# Patient Record
Sex: Male | Born: 1990 | Race: White | Hispanic: No | Smoking: Never smoker
Health system: Southern US, Community
[De-identification: ages and names within clinical notes are randomized; demographics above are authoritative.]

## PROBLEM LIST (undated history)

## (undated) DIAGNOSIS — J45990 Exercise induced bronchospasm: Secondary | ICD-10-CM

## (undated) DIAGNOSIS — L0501 Pilonidal cyst with abscess: Secondary | ICD-10-CM

## (undated) DIAGNOSIS — T7840XA Allergy, unspecified, initial encounter: Secondary | ICD-10-CM

## (undated) DIAGNOSIS — E785 Hyperlipidemia, unspecified: Secondary | ICD-10-CM

## (undated) DIAGNOSIS — E7800 Pure hypercholesterolemia, unspecified: Secondary | ICD-10-CM

## (undated) HISTORY — DX: Pure hypercholesterolemia, unspecified: E78.00

## (undated) HISTORY — DX: Pilonidal cyst with abscess: L05.01

## (undated) HISTORY — DX: Hyperlipidemia, unspecified: E78.5

## (undated) HISTORY — DX: Allergy, unspecified, initial encounter: T78.40XA

## (undated) HISTORY — PX: WISDOM TOOTH EXTRACTION: SHX21

## (undated) HISTORY — DX: Exercise induced bronchospasm: J45.990

---

## 2015-11-24 ENCOUNTER — Encounter: Payer: Self-pay | Admitting: Physician Assistant

## 2015-11-24 ENCOUNTER — Ambulatory Visit (INDEPENDENT_AMBULATORY_CARE_PROVIDER_SITE_OTHER): Payer: BLUE CROSS/BLUE SHIELD | Admitting: Physician Assistant

## 2015-11-24 VITALS — BP 118/77 | HR 63 | Ht 70.0 in | Wt 181.0 lb

## 2015-11-24 DIAGNOSIS — Z0000 Encounter for general adult medical examination without abnormal findings: Secondary | ICD-10-CM

## 2015-11-24 DIAGNOSIS — J45990 Exercise induced bronchospasm: Secondary | ICD-10-CM

## 2015-11-24 DIAGNOSIS — Z13220 Encounter for screening for lipoid disorders: Secondary | ICD-10-CM

## 2015-11-24 DIAGNOSIS — Z131 Encounter for screening for diabetes mellitus: Secondary | ICD-10-CM | POA: Diagnosis not present

## 2015-11-24 HISTORY — DX: Exercise induced bronchospasm: J45.990

## 2015-11-24 NOTE — Progress Notes (Signed)
   Subjective:    Patient ID: Troy Estrada, male    DOB: 11/06/1990, 25 y.o.   MRN: [id number]  HPI Pt is a 25 yo male who presents to the clinic to establish care.   He has exercise induced asthma. Exercises daily and does not use inhaler.  He is on no medications.   .. Family History  Problem Relation Age of Onset  . Depression Mother   . Hyperlipidemia Mother   . Hypertension Mother   . Depression Father   . Diabetes Father   . Hyperlipidemia Father   . Depression Sister   . Depression Brother   . Diabetes Maternal Uncle   . Cancer Maternal Grandmother     cervical  . Diabetes Maternal Grandmother   . Hypertension Maternal Grandmother   . Alzheimer's disease Maternal Grandmother   . Cancer Maternal Grandfather     prostate  . Hypertension Maternal Grandfather   . Dementia Paternal Grandfather   . Depression Sister   . Bipolar disorder Sister    .[name]. Social History   Social History  . Marital Status: Unknown    Spouse Name: N/A  . Number of Children: N/A  . Years of Education: N/A   Occupational History  . Not on file.   Social History Main Topics  . Smoking status: Never Smoker   . Smokeless tobacco: Not on file  . Alcohol Use: 0.0 oz/week    0 Standard drinks or equivalent per week  . Drug Use: No  . Sexual Activity: Not Currently   Other Topics Concern  . Not on file   Social History Narrative  . No narrative on file   He has not seen health care provider in a while and wanted a check up.    Review of Systems  All other systems reviewed and are negative.      Objective:   Physical Exam  Constitutional: He is oriented to person, place, and time. He appears well-developed and well-nourished.  HENT:  Head: Normocephalic and atraumatic.  Right Ear: External ear normal.  Left Ear: External ear normal.  Nose: Nose normal.  Mouth/Throat: Oropharynx is clear and moist. No oropharyngeal exudate.  Eyes: Conjunctivae are normal. Right eye exhibits no  discharge. Left eye exhibits no discharge.  Neck: Normal range of motion. Neck supple. No thyromegaly present.  Cardiovascular: Normal rate, regular rhythm and normal heart sounds.   Pulmonary/Chest: Effort normal and breath sounds normal.  Abdominal: Soft. Bowel sounds are normal. There is no tenderness.  Lymphadenopathy:    He has no cervical adenopathy.  Neurological: He is alert and oriented to person, place, and time.  Skin: Skin is dry.  Psychiatric: He has a normal mood and affect. His behavior is normal.          Assessment & Plan:  Screening labs ordered lipid, cmp, cbc.   Elevated BP- rechecked and went down. Reassured patient.

## 2015-11-24 NOTE — Patient Instructions (Signed)

## 2015-12-03 DIAGNOSIS — Z13220 Encounter for screening for lipoid disorders: Secondary | ICD-10-CM | POA: Diagnosis not present

## 2015-12-03 DIAGNOSIS — Z0000 Encounter for general adult medical examination without abnormal findings: Secondary | ICD-10-CM | POA: Diagnosis not present

## 2015-12-03 DIAGNOSIS — Z131 Encounter for screening for diabetes mellitus: Secondary | ICD-10-CM | POA: Diagnosis not present

## 2015-12-03 LAB — CBC WITH DIFFERENTIAL/PLATELET
BASOS PCT: 1 %
Basophils Absolute: 46 cells/uL (ref 0–200)
EOS PCT: 3 %
Eosinophils Absolute: 138 cells/uL (ref 15–500)
HEMATOCRIT: 40.9 % (ref 38.5–50.0)
HEMOGLOBIN: 13.7 g/dL (ref 13.2–17.1)
LYMPHS ABS: 1978 {cells}/uL (ref 850–3900)
Lymphocytes Relative: 43 %
MCH: 29.1 pg (ref 27.0–33.0)
MCHC: 33.5 g/dL (ref 32.0–36.0)
MCV: 86.8 fL (ref 80.0–100.0)
MONO ABS: 322 {cells}/uL (ref 200–950)
MPV: 10.3 fL (ref 7.5–12.5)
Monocytes Relative: 7 %
NEUTROS ABS: 2116 {cells}/uL (ref 1500–7800)
Neutrophils Relative %: 46 %
Platelets: 275 10*3/uL (ref 140–400)
RBC: 4.71 MIL/uL (ref 4.20–5.80)
RDW: 13 % (ref 11.0–15.0)
WBC: 4.6 10*3/uL (ref 3.8–10.8)

## 2015-12-04 LAB — COMPLETE METABOLIC PANEL WITH GFR
ALBUMIN: 4.3 g/dL (ref 3.6–5.1)
ALK PHOS: 80 U/L (ref 40–115)
ALT: 13 U/L (ref 9–46)
AST: 14 U/L (ref 10–40)
BUN: 17 mg/dL (ref 7–25)
CALCIUM: 9.5 mg/dL (ref 8.6–10.3)
CO2: 27 mmol/L (ref 20–31)
Chloride: 103 mmol/L (ref 98–110)
Creat: 0.94 mg/dL (ref 0.60–1.35)
GFR, Est African American: >89 mL/min (ref >=60)
GFR, Est Non African American: >89 mL/min (ref >=60)
GLUCOSE: 94 mg/dL (ref 65–99)
Potassium: 4.6 mmol/L (ref 3.5–5.3)
Sodium: 140 mmol/L (ref 135–146)
TOTAL PROTEIN: 6.9 g/dL (ref 6.1–8.1)
Total Bilirubin: 0.3 mg/dL (ref 0.2–1.2)

## 2015-12-04 LAB — LIPID PANEL
CHOLESTEROL: 209 mg/dL — AB (ref 125–200)
HDL: 52 mg/dL (ref >=40)
LDL Cholesterol: 144 mg/dL — ABNORMAL HIGH (ref <130)
Total CHOL/HDL Ratio: 4 Ratio (ref <=5.0)
Triglycerides: 64 mg/dL (ref <150)
VLDL: 13 mg/dL (ref <30)

## 2015-12-05 ENCOUNTER — Encounter: Payer: Self-pay | Admitting: Physician Assistant

## 2015-12-05 DIAGNOSIS — E7800 Pure hypercholesterolemia, unspecified: Secondary | ICD-10-CM

## 2015-12-05 HISTORY — DX: Pure hypercholesterolemia, unspecified: E78.00

## 2016-02-08 DIAGNOSIS — Z23 Encounter for immunization: Secondary | ICD-10-CM | POA: Diagnosis not present

## 2016-05-19 DIAGNOSIS — D2239 Melanocytic nevi of other parts of face: Secondary | ICD-10-CM | POA: Diagnosis not present

## 2016-05-19 DIAGNOSIS — D2262 Melanocytic nevi of left upper limb, including shoulder: Secondary | ICD-10-CM | POA: Diagnosis not present

## 2016-05-19 DIAGNOSIS — D2261 Melanocytic nevi of right upper limb, including shoulder: Secondary | ICD-10-CM | POA: Diagnosis not present

## 2016-05-19 DIAGNOSIS — D224 Melanocytic nevi of scalp and neck: Secondary | ICD-10-CM | POA: Diagnosis not present

## 2017-01-23 DIAGNOSIS — Z23 Encounter for immunization: Secondary | ICD-10-CM | POA: Diagnosis not present

## 2017-05-06 DIAGNOSIS — J069 Acute upper respiratory infection, unspecified: Secondary | ICD-10-CM | POA: Diagnosis not present

## 2017-05-06 DIAGNOSIS — R05 Cough: Secondary | ICD-10-CM | POA: Diagnosis not present

## 2017-05-25 ENCOUNTER — Telehealth: Payer: Self-pay

## 2017-05-25 NOTE — Telephone Encounter (Signed)
Patient called in stating he will be traveling to [place] and wanted to know what vaccinations he should obtain as well as anything he should be aware of traveling out of the country.  Patient also stated he did not know how to go about getting vaccinations...whether our office can do the vaccinations or should he contact the health department. Patient stated he did not know if an office visit is needed to discuss this and his vaccinations.

## 2017-05-28 NOTE — Telephone Encounter (Signed)
I would make sure you had flu shot, tetanus shot, hep A, typhoid, malaria. If going to higher elevations then need yellow fever. I would suggest going to health department as there are vaccines that we don't have. You can also go to CDC travel for [place] and print off suggested vaccines to get.

## 2017-05-30 NOTE — Telephone Encounter (Signed)
Patient scheduled for travel medication appointment. To get started on the vaccines we provide in the office.

## 2017-06-05 ENCOUNTER — Encounter: Payer: Self-pay | Admitting: Physician Assistant

## 2017-06-05 ENCOUNTER — Ambulatory Visit (INDEPENDENT_AMBULATORY_CARE_PROVIDER_SITE_OTHER): Payer: BLUE CROSS/BLUE SHIELD | Admitting: Physician Assistant

## 2017-06-05 VITALS — BP 134/77 | HR 67 | Ht 70.0 in | Wt 190.0 lb

## 2017-06-05 DIAGNOSIS — Z7189 Other specified counseling: Secondary | ICD-10-CM | POA: Diagnosis not present

## 2017-06-05 DIAGNOSIS — Z0184 Encounter for antibody response examination: Secondary | ICD-10-CM | POA: Diagnosis not present

## 2017-06-05 DIAGNOSIS — Z7184 Encounter for health counseling related to travel: Secondary | ICD-10-CM

## 2017-06-05 DIAGNOSIS — Z23 Encounter for immunization: Secondary | ICD-10-CM | POA: Diagnosis not present

## 2017-06-05 MED ORDER — TYPHOID VACCINE PO CPDR: 1.0000 | DELAYED_RELEASE_CAPSULE | ORAL | 0 refills | Status: DC

## 2017-06-05 MED ORDER — PRIMAQUINE PHOSPHATE 26.3 MG PO TABS: 30.0000 mg | ORAL_TABLET | Freq: Every day | ORAL | 0 refills | Status: DC

## 2017-06-06 ENCOUNTER — Encounter: Payer: Self-pay | Admitting: Physician Assistant

## 2017-06-06 LAB — HEPATITIS B SURFACE ANTIBODY,QUALITATIVE: Hep B S Ab: NONREACTIVE

## 2017-06-06 NOTE — Progress Notes (Signed)
   Subjective:    Patient ID: Troy Estrada, male    DOB: 08/31/1990, 27 y.o.   MRN: [id number]  HPI Pt is a 27 yo male who presents to the clinic to discuss vaccines needed for travel to [place] in June 2019. He will be traveling throughout the country and doing a lot of hiking.    Review of Systems  All other systems reviewed and are negative.      Objective:   Physical Exam  Constitutional: He is oriented to person, place, and time. He appears well-developed and well-nourished.  Cardiovascular: Normal rate, regular rhythm and normal heart sounds.  Neurological: He is alert and oriented to person, place, and time.  Psychiatric: He has a normal mood and affect. His behavior is normal.          Assessment & Plan:  [name].[name].Diagnoses and all orders for this visit:  Counseling about travel -     typhoid (VIVOTIF) DR capsule; Take 1 capsule by mouth every other day. For one week to end one week before travel. -     Hepatitis B Surface AntiBODY -     primaquine 26.3 MG tablet; Take 2 tablets (30 mg total) by mouth daily. Start 1-2 days before travel and then 7 days after arrive back home. -     Hepatitis A vaccine adult IM -     Tdap vaccine greater than or equal to 7yo IM  Immunity status testing -     Hepatitis B Surface AntiBODY  Other orders -     Cancel: Tdap vaccine greater than or equal to 7yo IM -     Cancel: Flu Vaccine QUAD 36+ mos IM -     Cancel: Hepatitis A hepatitis B combined vaccine IM   Discussed with patient recommended vaccines for travel to [place] via CDC.  Pt states he has all of his childhood vaccines except he is unsure about hep B.  Ordered titer to confirm.  Given hep A first dose to return in 6 months for the next dose.  Tdap given.  Flu shot up to date.  Oral vaccine for typhoid given.  Malaria preventative given.  We do not have rabies or yellow fever vaccines and must go to the health department.    When closer to time he leaves agreed to give zofran  for any unexpected nausea and cipro course for any travelers diarrhea.   [name]..Spent 30 minutes with patient and greater than 50 percent of visit spent counseling patient regarding treatment plan.

## 2017-06-08 NOTE — Progress Notes (Signed)
He will have to call health department for injection prices for typhoid?  Does he mean typhoid? Malaria is oral only. Confirm and we could call pharmacy to see what is the cheapest there are multiple ones.

## 2017-06-15 ENCOUNTER — Ambulatory Visit (INDEPENDENT_AMBULATORY_CARE_PROVIDER_SITE_OTHER): Payer: BLUE CROSS/BLUE SHIELD | Admitting: Physician Assistant

## 2017-06-15 VITALS — BP 136/78 | HR 73 | Temp 98.6°F

## 2017-06-15 DIAGNOSIS — Z23 Encounter for immunization: Secondary | ICD-10-CM

## 2017-06-15 NOTE — Progress Notes (Signed)
Pt came into clinic to start his Hep B vaccine series. Based on lab work, he was not immune. Pt is going out of country and these vaccines were recommended. Pt tolerated immunization administration in right deltoid well, no immediate complications. Pt reports he does not need a printed copy at this time. Advised to follow up for second immunization in 1 month. Verbalized understanding.

## 2017-07-09 ENCOUNTER — Ambulatory Visit: Payer: BLUE CROSS/BLUE SHIELD

## 2017-07-24 ENCOUNTER — Ambulatory Visit (INDEPENDENT_AMBULATORY_CARE_PROVIDER_SITE_OTHER): Payer: BLUE CROSS/BLUE SHIELD | Admitting: Physician Assistant

## 2017-07-24 VITALS — BP 110/75 | HR 60 | Temp 98.2°F | Resp 16 | Wt 190.6 lb

## 2017-07-24 DIAGNOSIS — Z23 Encounter for immunization: Secondary | ICD-10-CM | POA: Diagnosis not present

## 2017-07-24 MED ORDER — HEPATITIS B VAC RECOMBINANT 10 MCG/ML IJ SUSP: 1.0000 mL | Freq: Once | INTRAMUSCULAR | Status: DC

## 2017-07-24 NOTE — Progress Notes (Signed)
HPI: Patient is here for his second Hepatitis B vaccination. Patient states he did fine with his first vaccination. Patient denies fatigue, rashes, shortness of breath, chest pains.  Assessment and Plan: Patient tolerated injection - Left Deltoid - well without complications. Patient advised to schedule third vaccination six months from first vaccine - 06/15/17 - which would be in August.    Agree with above plan. Tandy GawJade Breeback PA-C

## 2017-07-31 ENCOUNTER — Ambulatory Visit (INDEPENDENT_AMBULATORY_CARE_PROVIDER_SITE_OTHER): Payer: BLUE CROSS/BLUE SHIELD | Admitting: Physician Assistant

## 2017-07-31 ENCOUNTER — Encounter: Payer: Self-pay | Admitting: Physician Assistant

## 2017-07-31 VITALS — BP 132/79 | HR 58 | Ht 70.0 in | Wt 187.0 lb

## 2017-07-31 DIAGNOSIS — Z7189 Other specified counseling: Secondary | ICD-10-CM

## 2017-07-31 DIAGNOSIS — Z7184 Encounter for health counseling related to travel: Secondary | ICD-10-CM

## 2017-07-31 DIAGNOSIS — J45990 Exercise induced bronchospasm: Secondary | ICD-10-CM | POA: Diagnosis not present

## 2017-07-31 MED ORDER — ONDANSETRON HCL 4 MG PO TABS: 4.0000 mg | ORAL_TABLET | Freq: Three times a day (TID) | ORAL | 0 refills | Status: DC | PRN

## 2017-07-31 MED ORDER — ALBUTEROL SULFATE HFA 108 (90 BASE) MCG/ACT IN AERS
2.0000 | INHALATION_SPRAY | Freq: Four times a day (QID) | RESPIRATORY_TRACT | 1 refills | Status: DC | PRN
Start: 2017-07-31 — End: 2018-09-23

## 2017-07-31 NOTE — Progress Notes (Signed)
   Subjective:    Patient ID: Troy Estrada, male    DOB: Jul 24, 1990, 27 y.o.   MRN: [id number]  HPI Pt is a 27 yo male who presents to the clinic to discuss getting some prescriptions for his travel in June to [place].   He is in process of getting hep A and B vaccines.   He has a hx of exercise induced asthma and would like an inhaler due to some hiking he will be doing at high elevations.   .. Active Ambulatory Problems    Diagnosis Date Noted  . Exercise-induced asthma 11/24/2015  . Elevated LDL cholesterol level 12/05/2015   Resolved Ambulatory Problems    Diagnosis Date Noted  . No Resolved Ambulatory Problems   No Additional Past Medical History      Review of Systems  All other systems reviewed and are negative.      Objective:   Physical Exam  Constitutional: He is oriented to person, place, and time. He appears well-developed and well-nourished.  HENT:  Head: Normocephalic and atraumatic.  Neurological: He is alert and oriented to person, place, and time.  Psychiatric: He has a normal mood and affect. His behavior is normal.          Assessment & Plan:  Marland KitchenMarland KitchenDiagnoses and all orders for this visit:  Exercise-induced asthma -     albuterol (PROVENTIL HFA;VENTOLIN HFA) 108 (90 Base) MCG/ACT inhaler; Inhale 2 puffs into the lungs every 6 (six) hours as needed for wheezing or shortness of breath.  Counseling for travel -     ondansetron (ZOFRAN) 4 MG tablet; Take 1 tablet (4 mg total) by mouth every 8 (eight) hours as needed for nausea or vomiting. -     albuterol (PROVENTIL HFA;VENTOLIN HFA) 108 (90 Base) MCG/ACT inhaler; Inhale 2 puffs into the lungs every 6 (six) hours as needed for wheezing or shortness of breath.   Made appt for follow up hep A and hep B.  Albuterol rx given for as needed.  zofran for travel bag.

## 2017-09-20 ENCOUNTER — Encounter: Payer: Self-pay | Admitting: Family Medicine

## 2017-09-20 ENCOUNTER — Ambulatory Visit: Payer: BLUE CROSS/BLUE SHIELD | Admitting: Family Medicine

## 2017-09-20 VITALS — BP 135/72 | HR 97 | Ht 70.0 in | Wt 189.0 lb

## 2017-09-20 DIAGNOSIS — T148XXA Other injury of unspecified body region, initial encounter: Secondary | ICD-10-CM | POA: Diagnosis not present

## 2017-09-20 DIAGNOSIS — S50861A Insect bite (nonvenomous) of right forearm, initial encounter: Secondary | ICD-10-CM

## 2017-09-20 DIAGNOSIS — W57XXXA Bitten or stung by nonvenomous insect and other nonvenomous arthropods, initial encounter: Secondary | ICD-10-CM

## 2017-09-20 DIAGNOSIS — R21 Rash and other nonspecific skin eruption: Secondary | ICD-10-CM | POA: Diagnosis not present

## 2017-09-20 MED ORDER — TRIAMCINOLONE ACETONIDE 0.5 % EX OINT: 1.0000 "application " | TOPICAL_OINTMENT | Freq: Two times a day (BID) | CUTANEOUS | 1 refills | Status: DC

## 2017-09-20 NOTE — Patient Instructions (Signed)
Thank you for coming in today. Complete typhoid vaccine series.  Apply the ointment to the itch 2-3 x daily.  Stop when the itchy bump goes away.  Let me know if you feel sick or have a fever or body aches or headache.   Recheck as needed.

## 2017-09-21 NOTE — Progress Notes (Signed)
Troy Estrada is a 27 y.o. male who presents to [facility] today for rash.  Injury notes a small erythematous itchy papules on his right forearm.  He cannot think of any exposures.  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 He has been outside landing grass as part of his job.  He denies any tick bites.  He was seen in urgent care he had a blood test that he thinks is from Lyme and was prescribed triamcinolone lotion.  He has not filled the prescription yet as he wants a second opinion.  He feels well with no fevers or chills nausea vomiting or other rash.   ROS as above:  Exam:  BP 135/72   Pulse 97   Ht  (1.778 m)   Wt 189 lb (85.7 kg)   BMI 27.12 kg/m  Gen: Well NAD HEENT: EOMI,  MMM Lungs: Normal work of breathing. CTABL Heart: RRR no MRG Abd: NABS, Soft. Nondistended, Nontender Exts: Brisk capillary refill, warm and well perfused.  Skin: Small 2 mm erythematous papule right forearm.     Assessment and Plan: 27 y.o. male with small erythematous papule likely local reaction to arthropod bite.  Very doubtful for tick bite or tick borne illness.  Treatment with triamcinolone ointment.  If patient became symptomatic systemically prescribed doxycycline and have patient return to clinic for serology testing.   No orders of the defined types were placed in this encounter.  Meds ordered this encounter  Medications  . triamcinolone ointment (KENALOG) 0.5 %    Sig: Apply 1 application topically 2 (two) times daily. To affected area, avoid eyes and face    Dispense:  30 g    Refill:  1     Historical information moved to improve visibility of documentation.  No past medical history on file. No past surgical history on file. Social History   Tobacco Use  . Smoking status: Never Smoker  . Smokeless tobacco: Never Used  Substance Use Topics  . Alcohol  use: Yes    Alcohol/week: 0.0 oz   family history includes Alzheimer's disease in his maternal grandmother; Bipolar disorder in his sister; Cancer in his maternal grandfather and maternal grandmother; Dementia in his paternal grandfather; Depression in his brother, father, mother, sister, and sister; Diabetes in his father, maternal grandmother, and maternal uncle; Hyperlipidemia in his father and mother; Hypertension in his maternal grandfather, maternal grandmother, and mother.  Medications: Current Outpatient Medications  Medication Sig Dispense Refill  . albuterol (PROVENTIL HFA;VENTOLIN HFA) 108 (90 Base) MCG/ACT inhaler Inhale 2 puffs into the lungs every 6 (six) hours as needed for wheezing or shortness of breath. 1 Inhaler 1  . ondansetron (ZOFRAN) 4 MG tablet Take 1 tablet (4 mg total) by mouth every 8 (eight) hours as needed for nausea or vomiting. 20 tablet 0  . primaquine 26.3 MG tablet Take 2 tablets (30 mg total) by mouth daily. Start 1-2 days before travel and then 7 days after arrive back home. 40 tablet 0  . typhoid (VIVOTIF) DR capsule Take 1 capsule by mouth every other day. For one week to end one week before travel. 4 capsule 0  . triamcinolone ointment (KENALOG) 0.5 % Apply 1 application topically 2 (two) times daily. To affected area, avoid eyes and face 30 g 1   No current facility-administered medications for this visit.    No Known Allergies  Health Maintenance Health Maintenance  Topic Date Due  . HIV Screening  01/06/2006  . INFLUENZA VACCINE  12/06/2017  . TETANUS/TDAP  06/06/2027    Discussed warning signs or symptoms. Please see discharge instructions. Patient expresses understanding.

## 2017-12-12 ENCOUNTER — Ambulatory Visit (INDEPENDENT_AMBULATORY_CARE_PROVIDER_SITE_OTHER): Payer: BLUE CROSS/BLUE SHIELD | Admitting: Family Medicine

## 2017-12-12 VITALS — Temp 98.6°F

## 2017-12-12 DIAGNOSIS — Z23 Encounter for immunization: Secondary | ICD-10-CM

## 2017-12-12 MED ORDER — HEPATITIS A VACCINE 1440 EL U/ML IM SUSP: 1.0000 mL | Freq: Once | INTRAMUSCULAR | Status: DC

## 2017-12-12 MED ORDER — HEPATITIS B VAC RECOMBINANT 10 MCG/0.5ML IJ SUSP: 0.5000 mL | Freq: Once | INTRAMUSCULAR | Status: DC

## 2017-12-12 NOTE — Progress Notes (Signed)
Agree with documentation as above.   Sueellen Kayes, MD  

## 2017-12-12 NOTE — Progress Notes (Signed)
   Subjective:    Patient ID: Troy Estrada, male    DOB: 04/12/1991, 27 y.o.   MRN: [id number]  HPI  Greig Castillandrew is here for last Hep A and Hep B vaccine.   Review of Systems     Objective:   Physical Exam        Assessment & Plan:  Vaccines - Patient tolerated injection well without complications.

## 2018-01-30 DIAGNOSIS — Z23 Encounter for immunization: Secondary | ICD-10-CM | POA: Diagnosis not present

## 2018-09-04 ENCOUNTER — Encounter: Payer: Self-pay | Admitting: Physician Assistant

## 2018-09-05 ENCOUNTER — Encounter: Payer: Self-pay | Admitting: Family Medicine

## 2018-09-05 ENCOUNTER — Other Ambulatory Visit: Payer: Self-pay

## 2018-09-05 ENCOUNTER — Ambulatory Visit: Payer: BLUE CROSS/BLUE SHIELD | Admitting: Family Medicine

## 2018-09-05 VITALS — BP 137/86 | HR 112 | Temp 99.2°F | Wt 185.0 lb

## 2018-09-05 DIAGNOSIS — L0501 Pilonidal cyst with abscess: Secondary | ICD-10-CM

## 2018-09-05 HISTORY — PX: PILONIDAL CYST DRAINAGE: SHX743

## 2018-09-05 MED ORDER — DOXYCYCLINE HYCLATE 100 MG PO TABS: 100.0000 mg | ORAL_TABLET | Freq: Two times a day (BID) | ORAL | 0 refills | Status: DC

## 2018-09-05 NOTE — Progress Notes (Signed)
Troy Estrada is a 28 y.o. male who [facility] today for sacral pain.  Patient noted pain and swelling in his sacral region starting about 2 weeks ago.  He notes is become red painful and swollen.  He notes it started draining some foul-smelling purulent material about 2 days ago.  Symptoms have been worsening slowly.  He is tried Tylenol which helps.  He can recall back a few months ago he had a similar emesis episode which resolved spontaneously.  He has never had a formal diagnosis of pilonidal cyst or had incision and drainage of a cyst in this area.  He denies any fevers or chills nausea vomiting or diarrhea.  He rates his current symptoms is quite bothersome.  He denies fevers chills vomiting or diarrhea.  ROS as above:  Exam:  BP 137/86   Pulse (!) 112   Temp 99.2 F (37.3 C) (Oral)   Wt 185 lb (83.9 kg)   BMI 26.54 kg/m  Wt Readings from Last 5 Encounters:  09/05/18 185 lb (83.9 kg)  09/20/17 189 lb (85.7 kg)  07/31/17 187 lb (84.8 kg)  07/24/17 190 lb 9.6 oz (86.5 kg)  06/05/17 190 lb (86.2 kg)    Gen: Well NAD HEENT: EOMI,  MMM Lungs: Normal work of breathing. CTABL Heart: RRR no MRG Abd: NABS, Soft. Nondistended, Nontender Exts: Brisk capillary refill, warm and well perfused.  Skin: Erythematous swollen tender area at sacrum/coccyx area.  Small papule.  Thin foul-smelling purulent Cherrelle expressible.  Very tender to touch.   Abscess incision and drainage: Pilonidal cyst abscess . Consent obtained and timeout performed. Skin cleaned with alcohol, and cold spray applied. 3 mL of lidocaine  injected achieving good anesthesia. Skin was again cleaned with alcohol. A sharp incision was made to the area of fluctuance. The incision was widened and pus was expressed. Pus was cultured. Blunt dissection was used to break up loculations. Further  pus was expressed. Patient tolerated the procedure well. A dressing was applied   Lab and Radiology Results Results for orders placed or performed in visit on 09/05/18 (from the past 72 hour(s))  Wound culture     Status: None (Preliminary result)   Collection Time: 09/05/18  2:34 PM  Result Value Ref Range   MICRO NUMBER: [id number]    SPECIMEN QUALITY: Adequate    SOURCE: CYST    STATUS: PRELIMINARY    GRAM STAIN:      Few White blood cells seen Rare epithelial cells Rare Gram negative bacilli   No results found.    Assessment and Plan: 28 y.o. male with  Pilonidal cyst abscess.  Incised and drained today.  Culture obtained and patient was prescribed doxycycline.  Hold off on taking it unless he does not have rapid improvement.  He could potentially take doxycycline in the future if the symptoms start recurring.  Additionally discussed using chlorhexidine for good hygiene in this area once the wound is healed.  Discussed wound management strategies as well.  Recheck as needed in the near future.  PDMP not reviewed this encounter. Orders Placed This Encounter  Procedures  . Wound culture    Order Specific Question:   Source    Answer:   pilidinal cyst   Meds ordered this encounter  Medications  . doxycycline (VIBRA-TABS) 100 MG tablet    Sig: Take 1 tablet (100 mg total) by mouth 2 (two) times daily.  Dispense:  14 tablet    Refill:  0     Historical information moved to improve visibility of documentation.  Past Medical History:  Diagnosis Date  . Elevated LDL cholesterol level 12/05/2015  . Exercise-induced asthma 11/24/2015  . Pilonidal cyst with abscess 09/06/2018   Past Surgical History:  Procedure Laterality Date  . PILONIDAL CYST DRAINAGE N/A 09/05/2018   In office I&D   Social History   Tobacco Use  . Smoking status: Never Smoker  . Smokeless tobacco: Never Used  Substance Use Topics  . Alcohol use: Yes    Alcohol/week: 0.0 standard drinks   family  history includes Alzheimer's disease in his maternal grandmother; Bipolar disorder in his sister; Cancer in his maternal grandfather and maternal grandmother; Dementia in his paternal grandfather; Depression in his brother, father, mother, sister, and sister; Diabetes in his father, maternal grandmother, and maternal uncle; Hyperlipidemia in his father and mother; Hypertension in his maternal grandfather, maternal grandmother, and mother.  Medications: Current Outpatient Medications  Medication Sig Dispense Refill  . albuterol (PROVENTIL HFA;VENTOLIN HFA) 108 (90 Base) MCG/ACT inhaler Inhale 2 puffs into the lungs every 6 (six) hours as needed for wheezing or shortness of breath. 1 Inhaler 1  . doxycycline (VIBRA-TABS) 100 MG tablet Take 1 tablet (100 mg total) by mouth 2 (two) times daily. 14 tablet 0   No current facility-administered medications for this visit.    No Known Allergies   Discussed warning signs or symptoms. Please see discharge instructions. Patient expresses understanding.

## 2018-09-05 NOTE — Telephone Encounter (Signed)
Left a message for patient to schedule appointment.

## 2018-09-05 NOTE — Patient Instructions (Signed)
Thank you for coming in today. I will send you update when the culture is back.  I am sending in doxycycline antibiotic to take if it worsens or if it recurs.  Change dressing when it get dirty usually daily  Keep the skin clean once healed and consider chlorhexidine body wash in the future.    Incision and Drainage of a Pilonidal Cyst, Care After This sheet gives you information about how to care for yourself after your procedure. Your health care provider may also give you more specific instructions. If you have problems or questions, contact your health care provider. What can I expect after the procedure? After the procedure, it is common to have:  Pain that gets better when you take medicine.  Some fluid or blood coming from your wound. Follow these instructions at home: Medicines  Take over-the-counter and prescription medicines only as told by your health care provider.  If you were prescribed an antibiotic medicine, take it as told by your health care provider. Do not stop taking the antibiotic even if you start to feel better. Lifestyle  Do not do activities that irritate or put pressure on your buttocks for about 2 weeks, or as long as told by your health care provider. These activities include bike riding, running, and anything that involves a twisting motion.  Do not sit for long periods at a time without getting up to move around.  Sleep on your side instead of your back.  Avoid wearing tight underwear and tight pants. Bathing  Do not take baths or showers, swim, or use a hot tub until your health care provider approves. This depends on the type of wound you have from surgery.  While bathing, clean your buttocks area gently with soap and water.  After bathing: ? Pat the area dry with a soft, clean towel. ? Cover the area with a clean bandage (dressing), if told to by your health care provider. General instructions   If you are taking prescription pain medicine,  take actions to prevent or treat constipation. Your health care provider may recommend that you: ? Drink enough fluid to keep your urine pale yellow. ? Eat foods that are high in fiber, such as fresh fruits and vegetables, whole grains, and beans. ? Limit foods that are high in fat and processed sugars, such as fried or sweet foods. ? Take an over-the-counter or prescription medicine for constipation.  You will need to have a caregiver help you manage wound care and dressing changes. Your caregiver should: ? Wash his or her hands with soap and water before changing your dressing. If soap and water are not available, your caregiver should use hand sanitizer. ? Check your wound every day for signs of infection, such as:  Redness, swelling, or more pain.  More fluid or blood.  Warmth.  Pus or a bad smell. ? Follow any additional instructions from your health care provider on how to care for your wound, such as wound cleaning, wound flushing (irrigation), or packing your wound with a dressing.  Keep all follow-up visits as told by your health care provider. This is important. If you had incision and drainage with wound packing:  Return to your health care provider as instructed to have your packing material changed or removed.  Keep the area dry until your packing has been removed.  After the packing has been removed, you may start taking showers. If you had marsupialization:  You may start taking showers the day after  surgery, or when your health care provider approves.  Remove your dressing before you shower, but let the water from the shower moisten your dressing before you remove it. This will make it easier to remove.  Ask your health care provider when you can stop using a dressing. If you had incision and drainage without wound packing:  Change your dressing as directed.  Leave stitches (sutures), skin glue, or adhesive strips in place. These skin closures may need to stay in  place for 2 weeks or longer. If adhesive strip edges start to loosen and curl up, you may trim the loose edges. Do not remove adhesive strips completely unless your health care provider tells you to do that. Contact a health care provider if:  You have redness, swelling, or more pain around your wound.  You have more fluid or blood coming from your wound.  You have new bleeding from your wound.  Your wound feels warm to the touch.  There is pus or a bad smell coming from your wound.  You have pain that does not get better with medicine.  You have a fever or chills.  You have muscle aches.  You are dizzy.  You feel generally sick. Summary  After a procedure to drain a pilonidal cyst, it is common to have some fluid or blood coming from your wound.  If you were prescribed an antibiotic medicine, take it as told by your health care provider. Do not stop taking the antibiotic even if you start to feel better.  Return to your health care provider as instructed to have any packing material changed or removed. This information is not intended to replace advice given to you by your health care provider. Make sure you discuss any questions you have with your health care provider. Document Released: 05/25/2006 Document Revised: 04/16/2017 Document Reviewed: 04/16/2017 Elsevier Interactive Patient Education  2019 [employer]

## 2018-09-06 ENCOUNTER — Encounter: Payer: Self-pay | Admitting: Family Medicine

## 2018-09-06 DIAGNOSIS — L0501 Pilonidal cyst with abscess: Secondary | ICD-10-CM | POA: Insufficient documentation

## 2018-09-06 HISTORY — DX: Pilonidal cyst with abscess: L05.01

## 2018-09-11 LAB — WOUND CULTURE
MICRO NUMBER:: 436074
SPECIMEN QUALITY:: ADEQUATE

## 2018-09-12 ENCOUNTER — Encounter: Payer: Self-pay | Admitting: Family Medicine

## 2018-09-12 MED ORDER — CLINDAMYCIN HCL 300 MG PO CAPS: 300.0000 mg | ORAL_CAPSULE | Freq: Three times a day (TID) | ORAL | 0 refills | Status: DC

## 2018-09-23 ENCOUNTER — Encounter: Payer: Self-pay | Admitting: Family Medicine

## 2018-09-23 ENCOUNTER — Encounter: Payer: Self-pay | Admitting: Physician Assistant

## 2018-09-23 ENCOUNTER — Ambulatory Visit: Payer: BLUE CROSS/BLUE SHIELD | Admitting: Family Medicine

## 2018-09-23 VITALS — BP 114/69 | HR 67 | Temp 97.7°F | Wt 184.0 lb

## 2018-09-23 DIAGNOSIS — J45990 Exercise induced bronchospasm: Secondary | ICD-10-CM

## 2018-09-23 DIAGNOSIS — Z7184 Encounter for health counseling related to travel: Secondary | ICD-10-CM

## 2018-09-23 DIAGNOSIS — L0501 Pilonidal cyst with abscess: Secondary | ICD-10-CM | POA: Diagnosis not present

## 2018-09-23 MED ORDER — CLINDAMYCIN HCL 300 MG PO CAPS: 300.0000 mg | ORAL_CAPSULE | Freq: Three times a day (TID) | ORAL | 0 refills | Status: DC

## 2018-09-23 MED ORDER — ALBUTEROL SULFATE HFA 108 (90 BASE) MCG/ACT IN AERS: 2.0000 | INHALATION_SPRAY | Freq: Four times a day (QID) | RESPIRATORY_TRACT | 1 refills | Status: DC | PRN

## 2018-09-23 NOTE — Patient Instructions (Addendum)
Thank you for coming in today. Extend clindamycin antibiotic course for 9 more days.  If the drainage does not resolve and the pain returns let me know and we will likely drain it again.  I think it will go away soon with time and antibiotics.

## 2018-09-23 NOTE — Progress Notes (Signed)
Troy Estrada is a 28 y.o. male who presents to [facility]: Primary Care Sports Medicine today for follow-up pilonidal cyst abscess.  Patient was seen on April 30 for pilonidal cyst abscess.  He had incision and drainage at that time.  Culture was positive for Peptostreptococcus anaerobius.  He was treated empirically with doxycycline and then when symptoms returned on May 7 empirically with clindamycin.  In the interim he notes that he had a little bit of continued drainage.  He notes is not painful.  He feels pretty well with no fevers or chills.  He tolerates the clindamycin well.  He has about 2 more days left of the clindamycin.   ROS as above:  Exam:  BP 114/69    Pulse 67    Temp 97.7 F (36.5 C) (Oral)    Wt 184 lb (83.5 kg)    BMI 26.40 kg/m  Wt Readings from Last 5 Encounters:  09/23/18 184 lb (83.5 kg)  09/05/18 185 lb (83.9 kg)  09/20/17 189 lb (85.7 kg)  07/31/17 187 lb (84.8 kg)  07/24/17 190 lb 9.6 oz (86.5 kg)    Gen: Well NAD HEENT: EOMI,  MMM Lungs: Normal work of breathing. CTABL Heart: RRR no MRG Abd: NABS, Soft. Nondistended, Nontender Exts: Brisk capillary refill, warm and well perfused.  Skin: Small tiny nodule at gluteal cleft nontender no drainage.  No surrounding erythema or induration.  Lab and Radiology Results No results found for this or any previous visit (from the past 72 hour(s)). No results found.    Assessment and Plan: 28 y.o. male with resolving pilonidal cyst abscess.  Still draining a bit but no reaccumulation of abscess.  Patient has a tiny little nodule at the gluteal cleft.  Plan to extend clindamycin for 1 more week and watchful waiting.  Return for reexcision if abscess reaccumulate's and becomes painful.  I spent 15 minutes with this patient, greater than 50% was face-to-face time counseling regarding differential diagnosis treatment plan and  options.[name]  PDMP not reviewed this encounter. No orders of the defined types were placed in this encounter.  Meds ordered this encounter  Medications   clindamycin (CLEOCIN) 300 MG capsule    Sig: Take 1 capsule (300 mg total) by mouth 3 (three) times daily.    Dispense:  21 capsule    Refill:  0     Historical information moved to improve visibility of documentation.  Past Medical History:  Diagnosis Date   Elevated LDL cholesterol level 12/05/2015   Exercise-induced asthma 11/24/2015   Pilonidal cyst with abscess 09/06/2018   Past Surgical History:  Procedure Laterality Date   PILONIDAL CYST DRAINAGE N/A 09/05/2018   In office I&D   Social History   Tobacco Use   Smoking status: Never Smoker   Smokeless tobacco: Never Used  Substance Use Topics   Alcohol use: Yes    Alcohol/week: 0.0 standard drinks   family history includes Alzheimer's disease in his maternal grandmother; Bipolar disorder in his sister; Cancer in his maternal grandfather and maternal grandmother; Dementia in his paternal grandfather; Depression in his brother, father, mother, sister, and sister; Diabetes in his father, maternal grandmother, and maternal uncle; Hyperlipidemia in his father and mother; Hypertension in his maternal grandfather, maternal grandmother, and mother.  Medications: Current Outpatient Medications  Medication Sig Dispense Refill   albuterol (VENTOLIN HFA) 108 (90 Base) MCG/ACT inhaler Inhale 2 puffs into the lungs every 6 (six) hours as needed  for wheezing or shortness of breath. 1 Inhaler 1   clindamycin (CLEOCIN) 300 MG capsule Take 1 capsule (300 mg total) by mouth 3 (three) times daily. 21 capsule 0   No current facility-administered medications for this visit.    No Known Allergies   Discussed warning signs or symptoms. Please see discharge instructions. Patient expresses understanding.

## 2019-01-04 ENCOUNTER — Encounter: Payer: Self-pay | Admitting: Physician Assistant

## 2019-01-04 ENCOUNTER — Ambulatory Visit (INDEPENDENT_AMBULATORY_CARE_PROVIDER_SITE_OTHER)
Admission: RE | Admit: 2019-01-04 | Discharge: 2019-01-04 | Disposition: A | Discharge status: Home / Self Care | Payer: BC Managed Care – PPO | Source: Ambulatory Visit

## 2019-01-04 DIAGNOSIS — R05 Cough: Secondary | ICD-10-CM | POA: Diagnosis not present

## 2019-01-04 DIAGNOSIS — R6883 Chills (without fever): Secondary | ICD-10-CM

## 2019-01-04 DIAGNOSIS — Z20828 Contact with and (suspected) exposure to other viral communicable diseases: Secondary | ICD-10-CM | POA: Diagnosis not present

## 2019-01-04 NOTE — ED Provider Notes (Signed)
Virtual Visit via Video Note:  Troy Estrada  initiated request for Telemedicine visit with [facility] team. I connected with Troy Estrada  on 01/04/2019 at 9:25 AM  for a synchronized telemedicine visit using a video enabled HIPPA compliant telemedicine application. I verified that I am speaking with Troy Estrada  using two identifiers. Troy Hall-Potvin, PA-C  was physically located in a [facility] site and [name] was located at a different location.   The limitations of evaluation and management by telemedicine as well as the availability of in-person appointments were discussed. Patient was informed that he  may incur a bill ( including co-pay) for this virtual visit encounter. Troy Estrada  expressed understanding and gave verbal consent to proceed with virtual visit.     History of Present Illness:Troy Estrada  is a 28 y.o. male presents with concern regarding a shivering episode last night.  Patient states he went to bed at 11 last night, woke up around 1 AM.  States he had had a dream about shivering, woke up shivering.  [place] that he was shivering for a whole hour: Temperature at that time 99 Fahrenheit.  Patient states he also has been coughing a little bit more that evening, though has attributed this to increased dog care on his bed.  Patient felt briefly short of breath while coughing, though walking around improved.  Patient took his albuterol inhaler which helped a little bit.  Cough is dry, nonproductive, not hemoptic.  Took Tylenol this morning, though temperature prior to taken Tylenol was 98.7 Fahrenheit.  Patient has been working from home, no known COVID exposure contacts.  Wife also works from home, no known [place] exposures.  Patient did go [place] [employer] last [place], that was wearing a mask.  Patient denies fever, myalgias, vomiting, abdominal pain, diarrhea, chest pain.  Past Medical History:  Diagnosis Date  . Elevated LDL cholesterol level 12/05/2015   . Exercise-induced asthma 11/24/2015  . Pilonidal cyst with abscess 09/06/2018    No Known Allergies      Observations/Objective: 28 year old male Sitting in no acute distress.  Patient is able to speak in full sentences without coughing, sneezing, wheezing.  Does not appear to be ill.  Assessment and Plan: 1.  Shivering Patient seems to be concerned about single episode of shivering.  Will monitor for now, low concern for COVID at this time given lack of fever, symptoms, exposure.  Provided reassurance to patient satisfaction.  Follow Up Instructions: Patient to follow-up with PCP, urgent care, community testing states should he decide he wants to proceed with COVID testing.  Return precautions discussed, patient verbalized understanding and is agreeable to plan.   I discussed the assessment and treatment plan with the patient. The patient was provided an opportunity to ask questions and all were answered. The patient agreed with the plan and demonstrated an understanding of the instructions.   The patient was advised to call back or seek an in-person evaluation if the symptoms worsen or if the condition fails to improve as anticipated.  I provided 25 minutes of non-face-to-face time during this encounter.    [place], PA-C  01/04/2019 9:25 AM        Estrada, [place], PA-C 01/04/19 [id number]

## 2019-02-17 ENCOUNTER — Ambulatory Visit (INDEPENDENT_AMBULATORY_CARE_PROVIDER_SITE_OTHER): Payer: BC Managed Care – PPO | Admitting: Family Medicine

## 2019-02-17 DIAGNOSIS — Z23 Encounter for immunization: Secondary | ICD-10-CM

## 2019-03-10 DIAGNOSIS — Z20828 Contact with and (suspected) exposure to other viral communicable diseases: Secondary | ICD-10-CM | POA: Diagnosis not present

## 2019-04-08 DIAGNOSIS — Z20828 Contact with and (suspected) exposure to other viral communicable diseases: Secondary | ICD-10-CM | POA: Diagnosis not present

## 2019-05-14 ENCOUNTER — Other Ambulatory Visit: Payer: Self-pay

## 2019-05-14 ENCOUNTER — Ambulatory Visit (INDEPENDENT_AMBULATORY_CARE_PROVIDER_SITE_OTHER): Payer: BC Managed Care – PPO | Admitting: Physician Assistant

## 2019-05-14 ENCOUNTER — Encounter: Payer: Self-pay | Admitting: Physician Assistant

## 2019-05-14 VITALS — BP 129/90 | HR 77 | Ht 70.0 in | Wt 184.0 lb

## 2019-05-14 DIAGNOSIS — L0591 Pilonidal cyst without abscess: Secondary | ICD-10-CM | POA: Diagnosis not present

## 2019-05-14 DIAGNOSIS — K4090 Unilateral inguinal hernia, without obstruction or gangrene, not specified as recurrent: Secondary | ICD-10-CM

## 2019-05-14 MED ORDER — CLINDAMYCIN HCL 300 MG PO CAPS: 300.0000 mg | ORAL_CAPSULE | Freq: Three times a day (TID) | ORAL | 0 refills | Status: DC

## 2019-05-14 NOTE — Progress Notes (Addendum)
   Subjective:    Patient ID: Troy Estrada, male    DOB: May 25, 1990, 29 y.o.   MRN: [id number]  HPI Pt is a 29 yo male with history of pilonidal cyst the last 5 months who presents to the clinic with a recurrence.  He has had cyst abscess I&D twice in the last 5 months.  His last recurrence started over New Year's and was very painful.  He admits his girlfriend drained it and got a lot of purulent drainage as well as hair from the area.  It feels a lot better now but the cyst is still present.  He denies any fever, chills, nausea.  He has put some Bactroban over the area.  He also admits he noticed some swelling in his right inguinal area.  He feels like that has resolved. He felt some pressure but no pain.   .. Active Ambulatory Problems    Diagnosis Date Noted  . Exercise-induced asthma 11/24/2015  . Elevated LDL cholesterol level 12/05/2015  . Pilonidal cyst with abscess 09/06/2018  . Hernia, inguinal, right 05/14/2019  . Pilonidal cyst 05/14/2019   Resolved Ambulatory Problems    Diagnosis Date Noted  . No Resolved Ambulatory Problems   No Additional Past Medical History      Review of Systems See HPI     Objective:   Physical Exam Vitals reviewed.  Constitutional:      Appearance: Normal appearance.  Cardiovascular:     Rate and Rhythm: Normal rate.  Pulmonary:     Effort: Pulmonary effort is normal.  Genitourinary:     Neurological:     Mental Status: He is alert.           Assessment & Plan:  Marland KitchenMarland KitchenLewayne was seen today for cyst.  Diagnoses and all orders for this visit:  Pilonidal cyst -     clindamycin (CLEOCIN) 300 MG capsule; Take 1 capsule (300 mg total) by mouth 3 (three) times daily. -     Ambulatory referral to General Surgery  Hernia, inguinal, right -     Ambulatory referral to General Surgery   Will refer to general surgery. He has had I and D twice and girlfriend drained the last time. Hold abx unless getting fever, chills, redder,  draining, and more painful. Encouraged warm compresses. Tylenol and ibuprofen as needed.   Small mass in right inguinal area. consistent with hernia. General surgery for consult. Not bothersome. Feels a bulge with some lifting.

## 2019-05-14 NOTE — Patient Instructions (Signed)
Hernia, Adult     A hernia happens when tissue inside your body pushes out through a weak spot in your belly muscles (abdominal wall). This makes a round lump (bulge). The lump may be:  In a scar from surgery that was done in your belly (incisional hernia).  Near your belly button (umbilical hernia).  In your groin (inguinal hernia). Your groin is the area where your leg meets your lower belly (abdomen). This kind of hernia could also be: ? In your scrotum, if you are male. ? In folds of skin around your vagina, if you are male.  In your upper thigh (femoral hernia).  Inside your belly (hiatal hernia). This happens when your stomach slides above the muscle between your belly and your chest (diaphragm). If your hernia is small and it does not cause pain, you may not need treatment. If your hernia is large or it causes pain, you may need surgery. Follow these instructions at home: Activity  Avoid stretching or overusing (straining) the muscles near your hernia. Straining can happen when you: ? Lift something heavy. ? Poop (have a bowel movement).  Do not lift anything that is heavier than 10 lb (4.5 kg), or the limit that you are told, until your doctor says that it is safe.  Use the strength of your legs when you lift something heavy. Do not use only your back muscles to lift. General instructions  Do these things if told by your doctor so you do not have trouble pooping (constipation): ? Drink enough fluid to keep your pee (urine) pale yellow. ? Eat foods that are high in fiber. These include fresh fruits and vegetables, whole grains, and beans. ? Limit foods that are high in fat and processed sugars. These include foods that are fried or sweet. ? Take medicine for trouble pooping.  When you cough, try to cough gently.  You may try to push your hernia in by very gently pressing on it when you are lying down. Do not try to force the bulge back in if it will not push in easily.   If you are overweight, work with your doctor to lose weight safely.  Do not use any products that have nicotine or tobacco in them. These include cigarettes and e-cigarettes. If you need help quitting, ask your doctor.  If you will be having surgery (hernia repair), watch your hernia for changes in shape, size, or color. Tell your doctor if you see any changes.  Take over-the-counter and prescription medicines only as told by your doctor.  Keep all follow-up visits as told by your doctor. Contact a doctor if:  You get new pain, swelling, or redness near your hernia.  You poop fewer times in a week than normal.  You have trouble pooping.  You have poop (stool) that is more dry than normal.  You have poop that is harder or larger than normal. Get help right away if:  You have a fever.  You have belly pain that gets worse.  You feel sick to your stomach (nauseous).  You throw up (vomit).  Your hernia cannot be pushed in by very gently pressing on it when you are lying down. Do not try to force the bulge back in if it will not push in easily.  Your hernia: ? Changes in shape or size. ? Changes color. ? Feels hard or it hurts when you touch it. These symptoms may represent a serious problem that is an emergency. Do not   wait to see if the symptoms will go away. Get medical help right away. Call your local emergency services (911 in the [place] Summary  A hernia happens when tissue inside your body pushes out through a weak spot in the belly muscles. This creates a bulge.  If your hernia is small and it does not hurt, you may not need treatment. If your hernia is large or it hurts, you may need surgery.  If you will be having surgery, watch your hernia for changes in shape, size, or color. Tell your doctor about any changes. This information is not intended to replace advice given to you by your health care provider. Make sure you discuss any questions you have with your health  care provider. Document Revised: 08/15/2018 Document Reviewed: 01/24/2017 Elsevier Patient Education  [place]. Pilonidal Cyst  A pilonidal cyst is a fluid-filled sac that forms beneath the skin near the tailbone, at the top of the crease of the buttocks (pilonidal area). If the cyst is not large and not infected, it may not cause any problems. If the cyst becomes irritated or infected, it may get larger and fill with pus. An infected cyst is called an abscess. A pilonidal abscess may cause pain and swelling, and it may need to be drained or removed. What are the causes? The cause of this condition is not always known. In some cases, a hair that grows into your skin (ingrown hair) may be the cause. What increases the risk? You are more likely to get a pilonidal cyst if you:  Are male.  Have lots of hair near the crease of the buttocks.  Are overweight.  Have a dimple near the crease of the buttocks.  Wear tight clothing.  Do not bathe or shower often.  Sit for long periods of time. What are the signs or symptoms? Signs and symptoms of a pilonidal cyst may include pain, swelling, redness, and warmth in the pilonidal area. Depending on how big the cyst is, you may be able to feel a lump near your tailbone. If your cyst becomes infected, symptoms may include:  Pus or fluid drainage.  Fever.  Pain, swelling, and redness getting worse.  The lump getting bigger. How is this diagnosed? This condition may be diagnosed based on:  Your symptoms and medical history.  A physical exam.  A blood test to check for infection.  Testing a pus sample, if applicable. How is this treated? If your cyst does not cause symptoms, you may not need any treatment. If your cyst bothers you or is infected, you may need a procedure to drain or remove the cyst. Depending on the size, location, and severity of your cyst, your health care provider may:  Make an incision in the cyst and drain  it (incision and drainage).  Open and drain the cyst, and then stitch the wound so that it stays open while it heals (marsupialization). You will be given instructions about how to care for your open wound while it heals.  Remove all or part of the cyst, and then close the wound (cyst removal). You may need to take antibiotic medicines before your procedure. Follow these instructions at home: Medicines  Take over-the-counter and prescription medicines only as told by your health care provider.  If you were prescribed an antibiotic medicine, take it as told by your health care provider. Do not stop taking the antibiotic even if you start to feel better. General instructions  Keep the area  around your pilonidal cyst clean and dry.  If there is fluid or pus draining from your cyst: ? Cover the area with a clean bandage (dressing) as needed. ? Wash the area gently with soap and water. Pat the area dry with a clean towel. Do not rub the area because that may cause bleeding.  Remove hair from the area around the cyst only if your health care provider tells you to do this.  Do not wear tight pants or sit in one position for long periods at a time.  Keep all follow-up visits as told by your health care provider. This is important. Contact a health care provider if you have:  New redness, swelling, or pain.  A fever.  Severe pain. Summary  A pilonidal cyst is a fluid-filled sac that forms beneath the skin near the tailbone, at the top of the crease of the buttocks (pilonidal area).  If the cyst becomes irritated or infected, it may get larger and fill with pus. An infected cyst is called an abscess.  The cause of this condition is not always known. In some cases, a hair that grows into your skin (ingrown hair) may be the cause.  If your cyst does not cause symptoms, you may not need any treatment. If your cyst bothers you or is infected, you may need a procedure to drain or remove the  cyst. This information is not intended to replace advice given to you by your health care provider. Make sure you discuss any questions you have with your health care provider. Document Revised: 04/12/2017 Document Reviewed: 04/12/2017 Elsevier Patient Education  2020 [employer].

## 2019-05-20 ENCOUNTER — Encounter: Payer: Self-pay | Admitting: Physician Assistant

## 2019-07-21 ENCOUNTER — Ambulatory Visit: Payer: Self-pay | Admitting: Surgery

## 2019-07-21 DIAGNOSIS — L0591 Pilonidal cyst without abscess: Secondary | ICD-10-CM | POA: Diagnosis not present

## 2019-07-21 NOTE — H&P (Signed)
History of Present Illness Imogene Burn. Dailey Alberson MD; 07/21/2019 12:02 PM) The patient is a 29 year old male who presents with a pilonidal cyst. Referred by Iran Planas PA-C for pilonidal abscess, possible right inguinal hernia  This is a healthy 29 year old male who presents with a 15 month history of intermittent pain, swelling, and drainage near his tailbone. He underwent a limited incision and drainage in May 2020. However this never healed completely and he continues to see occasional drainage. Currently there is minimal discomfort and minimal drainage noted. He was examined by his PCP couple of months ago. There was a possible right groin mass at that time. This was either a hernia or lymphadenopathy. He presents now for evaluation to discuss surgery.   Past Surgical History Geni Bers [place], RMA; 07/21/2019 9:46 AM) Oral Surgery  Diagnostic Studies History Geni Bers [place], RMA; 07/21/2019 9:46 AM) Colonoscopy never  Allergies Geni Bers [place], RMA; 07/21/2019 9:46 AM) No Known Drug Allergies [07/21/2019]: Allergies Reconciled  Medication History [employer], RMA; 07/21/2019 9:46 AM) Ventolin HFA (108 (90 Base)MCG/ACT Aerosol Soln, Inhalation) Active. Medications Reconciled  Social History Geni Bers [place], RMA; 07/21/2019 9:46 AM) Caffeine use Carbonated beverages. No alcohol use No drug use Tobacco use Never smoker.  Family History Geni Bers [place], RMA; 07/21/2019 9:46 AM) Arthritis Mother. Depression Brother, Father, Mother, Sister. Diabetes Mellitus Father. Hypertension Mother. Malignant Neoplasm Of Pancreas Family Members In General. Melanoma Family Members In General. Prostate Cancer Family Members In General.  Other Problems Marguarite Arbour, RMA; 07/21/2019 9:46 AM) Asthma Other disease, cancer, significant illness     Review of Systems Geni Bers [place] RMA; 07/21/2019 9:46 AM) General Not Present- Appetite Loss,  Chills, Fatigue, Fever, Night Sweats, Weight Gain and Weight Loss. Skin Not Present- Change in Wart/Mole, Dryness, Hives, Jaundice, New Lesions, Non-Healing Wounds, Rash and Ulcer. HEENT Present- Nose Bleed and Seasonal Allergies. Not Present- Earache, Hearing Loss, Hoarseness, Oral Ulcers, Ringing in the Ears, Sinus Pain, Sore Throat, Visual Disturbances, Wears glasses/contact lenses and Yellow Eyes. Respiratory Not Present- Bloody sputum, Chronic Cough, Difficulty Breathing, Snoring and Wheezing. Breast Not Present- Breast Mass, Breast Pain, Nipple Discharge and Skin Changes. Cardiovascular Not Present- Chest Pain, Difficulty Breathing Lying Down, Leg Cramps, Palpitations, Rapid Heart Rate, Shortness of Breath and Swelling of Extremities. Gastrointestinal Not Present- Abdominal Pain, Bloating, Bloody Stool, Change in Bowel Habits, Chronic diarrhea, Constipation, Difficulty Swallowing, Excessive gas, Gets full quickly at meals, Hemorrhoids, Indigestion, Nausea, Rectal Pain and Vomiting. Male Genitourinary Not Present- Blood in Urine, Change in Urinary Stream, Frequency, Impotence, Nocturia, Painful Urination, Urgency and Urine Leakage. Musculoskeletal Not Present- Back Pain, Joint Pain, Joint Stiffness, Muscle Pain, Muscle Weakness and Swelling of Extremities. Neurological Not Present- Decreased Memory, Fainting, Headaches, Numbness, Seizures, Tingling, Tremor, Trouble walking and Weakness. Psychiatric Not Present- Anxiety, Bipolar, Change in Sleep Pattern, Depression, Fearful and Frequent crying. Endocrine Not Present- Cold Intolerance, Excessive Hunger, Hair Changes, Heat Intolerance, Hot flashes and New Diabetes. Hematology Not Present- Blood Thinners, Easy Bruising, Excessive bleeding, Gland problems, HIV and Persistent Infections.  Vitals Geni Bers [place] RMA; 07/21/2019 9:47 AM) 07/21/2019 9:46 AM Weight: 181.6 lb Height: 70in Height was reported by patient. Body Surface Area: 2 m  Body Mass Index: 26.06 kg/m  Temp.: 98.28F(Temporal)  Pulse: 90 (Regular)  P.OX: 98% (Room air) BP: 122/88 (Sitting, Left Arm, Standard)        Physical Exam Rodman Key K. Cam Dauphin MD; 07/21/2019 12:04 PM)  The physical exam findings are as follows: Note:Constitutional: WDWN in NAD, conversant, no obvious deformities Eyes: Pupils equal, round;  sclera anicteric; moist conjunctiva; no lid lag HENT: Oral mucosa moist; good dentition Neck: No masses palpated, trachea midline; no thyromegaly Lungs: CTA bilaterally; normal respiratory effort CV: Regular rate and rhythm; no murmurs; extremities well-perfused with no edema Abd: +bowel sounds, soft, non-tender, no palpable organomegaly; no sign of inguinal hernia on either side Musc: Normal gait; no apparent clubbing or cyanosis in extremities Lymphatic: No palpable cervical or axillary lymphadenopathy Skin: Warm, dry; no sign of jaundice; coccygeal region - 2 obvious skin pits with minimal drainage, but no erythema or induration. Psychiatric - alert and oriented x 4; calm mood and affect    Assessment & Plan Molli Hazard K. Basya Casavant MD; 07/21/2019 10:38 AM)  PILONIDAL CYST (L05.91)  Current Plans Schedule for Surgery - Pilonidal cystectomy. The surgical procedure has been discussed with the patient. Potential risks, benefits, alternative treatments, and expected outcomes have been explained. All of the patient's questions at this time have been answered. The likelihood of reaching the patient's treatment goal is good. The patient understand the proposed surgical procedure and wishes to proceed.  Wilmon Arms. Corliss Skains, MD, [facility]  General/ Trauma Surgery   07/21/2019 12:04 PM

## 2019-07-21 NOTE — H&P (View-Only) (Signed)
History of Present Illness (Troy Estrada K. Harvie Morua MD; 07/21/2019 12:02 PM) The patient is a 29 year old male who presents with a pilonidal cyst. Referred by Jade Breeback PA-C for pilonidal abscess, possible right inguinal hernia  This is a healthy 29-year-old male who presents with a 15 month history of intermittent pain, swelling, and drainage near his tailbone. He underwent a limited incision and drainage in May 2020. However this never healed completely and he continues to see occasional drainage. Currently there is minimal discomfort and minimal drainage noted. He was examined by his PCP couple of months ago. There was a possible right groin mass at that time. This was either a hernia or lymphadenopathy. He presents now for evaluation to discuss surgery.   Past Surgical History (Jacqueline Haggett, RMA; 07/21/2019 9:46 AM) Oral Surgery  Diagnostic Studies History (Jacqueline Haggett, RMA; 07/21/2019 9:46 AM) Colonoscopy never  Allergies (Jacqueline Haggett, RMA; 07/21/2019 9:46 AM) No Known Drug Allergies [07/21/2019]: Allergies Reconciled  Medication History (Jacqueline Haggett, RMA; 07/21/2019 9:46 AM) Ventolin HFA (108 (90 Base)MCG/ACT Aerosol Soln, Inhalation) Active. Medications Reconciled  Social History (Jacqueline Haggett, RMA; 07/21/2019 9:46 AM) Caffeine use Carbonated beverages. No alcohol use No drug use Tobacco use Never smoker.  Family History (Jacqueline Haggett, RMA; 07/21/2019 9:46 AM) Arthritis Mother. Depression Brother, Father, Mother, Sister. Diabetes Mellitus Father. Hypertension Mother. Malignant Neoplasm Of Pancreas Family Members In General. Melanoma Family Members In General. Prostate Cancer Family Members In General.  Other Problems (Jacqueline Haggett, RMA; 07/21/2019 9:46 AM) Asthma Other disease, cancer, significant illness     Review of Systems (Jacqueline Haggett RMA; 07/21/2019 9:46 AM) General Not Present- Appetite Loss,  Chills, Fatigue, Fever, Night Sweats, Weight Gain and Weight Loss. Skin Not Present- Change in Wart/Mole, Dryness, Hives, Jaundice, New Lesions, Non-Healing Wounds, Rash and Ulcer. HEENT Present- Nose Bleed and Seasonal Allergies. Not Present- Earache, Hearing Loss, Hoarseness, Oral Ulcers, Ringing in the Ears, Sinus Pain, Sore Throat, Visual Disturbances, Wears glasses/contact lenses and Yellow Eyes. Respiratory Not Present- Bloody sputum, Chronic Cough, Difficulty Breathing, Snoring and Wheezing. Breast Not Present- Breast Mass, Breast Pain, Nipple Discharge and Skin Changes. Cardiovascular Not Present- Chest Pain, Difficulty Breathing Lying Down, Leg Cramps, Palpitations, Rapid Heart Rate, Shortness of Breath and Swelling of Extremities. Gastrointestinal Not Present- Abdominal Pain, Bloating, Bloody Stool, Change in Bowel Habits, Chronic diarrhea, Constipation, Difficulty Swallowing, Excessive gas, Gets full quickly at meals, Hemorrhoids, Indigestion, Nausea, Rectal Pain and Vomiting. Male Genitourinary Not Present- Blood in Urine, Change in Urinary Stream, Frequency, Impotence, Nocturia, Painful Urination, Urgency and Urine Leakage. Musculoskeletal Not Present- Back Pain, Joint Pain, Joint Stiffness, Muscle Pain, Muscle Weakness and Swelling of Extremities. Neurological Not Present- Decreased Memory, Fainting, Headaches, Numbness, Seizures, Tingling, Tremor, Trouble walking and Weakness. Psychiatric Not Present- Anxiety, Bipolar, Change in Sleep Pattern, Depression, Fearful and Frequent crying. Endocrine Not Present- Cold Intolerance, Excessive Hunger, Hair Changes, Heat Intolerance, Hot flashes and New Diabetes. Hematology Not Present- Blood Thinners, Easy Bruising, Excessive bleeding, Gland problems, HIV and Persistent Infections.  Vitals (Jacqueline Haggett RMA; 07/21/2019 9:47 AM) 07/21/2019 9:46 AM Weight: 181.6 lb Height: 70in Height was reported by patient. Body Surface Area: 2 m  Body Mass Index: 26.06 kg/m  Temp.: 98.2F(Temporal)  Pulse: 90 (Regular)  P.OX: 98% (Room air) BP: 122/88 (Sitting, Left Arm, Standard)        Physical Exam (Troy Estrada K. Gottlieb Zuercher MD; 07/21/2019 12:04 PM)  The physical exam findings are as follows: Note:Constitutional: WDWN in NAD, conversant, no obvious deformities Eyes: Pupils equal, round;   sclera anicteric; moist conjunctiva; no lid lag HENT: Oral mucosa moist; good dentition Neck: No masses palpated, trachea midline; no thyromegaly Lungs: CTA bilaterally; normal respiratory effort CV: Regular rate and rhythm; no murmurs; extremities well-perfused with no edema Abd: +bowel sounds, soft, non-tender, no palpable organomegaly; no sign of inguinal hernia on either side Musc: Normal gait; no apparent clubbing or cyanosis in extremities Lymphatic: No palpable cervical or axillary lymphadenopathy Skin: Warm, dry; no sign of jaundice; coccygeal region - 2 obvious skin pits with minimal drainage, but no erythema or induration. Psychiatric - alert and oriented x 4; calm mood and affect    Assessment & Plan Troy Hazard K. Caleyah Jr MD; 07/21/2019 10:38 AM)  PILONIDAL CYST (L05.91)  Current Plans Schedule for Surgery - Pilonidal cystectomy. The surgical procedure has been discussed with the patient. Potential risks, benefits, alternative treatments, and expected outcomes have been explained. All of the patient's questions at this time have been answered. The likelihood of reaching the patient's treatment goal is good. The patient understand the proposed surgical procedure and wishes to proceed.  Wilmon Arms. Corliss Skains, MD, [facility]  General/ Trauma Surgery   07/21/2019 12:04 PM

## 2019-07-25 NOTE — Patient Instructions (Addendum)
DUE TO COVID-19 ONLY ONE VISITOR IS ALLOWED TO COME WITH YOU AND STAY IN THE WAITING ROOM ONLY DURING PRE OP AND PROCEDURE DAY OF SURGERY. THE 1 VISITOR MAY VISIT WITH YOU AFTER SURGERY IN YOUR PRIVATE ROOM DURING VISITING HOURS ONLY!  YOU NEED TO HAVE A COVID 19 TEST ON 07-28-19, PLEASE BEGIN THE QUARANTINE INSTRUCTIONS AS OUTLINED IN YOUR HANDOUT.                Troy Estrada  07/25/2019   Your procedure is scheduled on: 07-31-19   Report to [facility] Main  Entrance    Report to Admitting at 7:30 AM     Call this number if you have problems the morning of surgery [phone]    Remember: Do not eat food or drink liquids :After Midnight.     Take these medicines the morning of surgery with A SIP OF WATER: None   BRUSH YOUR TEETH MORNING OF SURGERY AND RINSE YOUR MOUTH OUT, NO CHEWING GUM CANDY OR MINTS.                           You may not have any metal on your body including hair pins and              piercings     Do not wear jewelry, cologne, lotions, powders or deodorant                         Men may shave face and neck.   Do not bring valuables to the hospital. [name] IS NOT             RESPONSIBLE   FOR VALUABLES.  Contacts, dentures or bridgework may not be worn into surgery.       Patients discharged the day of surgery will not be allowed to drive home. IF YOU ARE HAVING SURGERY AND GOING HOME THE SAME DAY, YOU MUST HAVE AN ADULT TO DRIVE YOU HOME AND BE WITH YOU FOR 24 HOURS. YOU MAY GO HOME BY TAXI OR UBER OR ORTHERWISE, BUT AN ADULT MUST ACCOMPANY YOU HOME AND STAY WITH YOU FOR 24 HOURS.  Name and phone number of your driver: Troy Estrada [phone]  Special Instructions: N/A              Please read over the following fact sheets you were given: _____________________________________________________________________             [facility] - Preparing for Surgery Before surgery, you can play an important role.  Because skin is not sterile,  your skin needs to be as free of germs as possible.  You can reduce the number of germs on your skin by washing with CHG (chlorahexidine gluconate) soap before surgery.  CHG is an antiseptic cleaner which kills germs and bonds with the skin to continue killing germs even after washing. Please DO NOT use if you have an allergy to CHG or antibacterial soaps.  If your skin becomes reddened/irritated stop using the CHG and inform your nurse when you arrive at Short Stay. Do not shave (including legs and underarms) for at least 48 hours prior to the first CHG shower.  You may shave your face/neck. Please follow these instructions carefully:  1.  Shower with CHG Soap the night before surgery and the  morning of Surgery.  2.  If you choose to wash your hair, wash your hair  first as usual with your  normal  shampoo.  3.  After you shampoo, rinse your hair and body thoroughly to remove the  shampoo.                           4.  Use CHG as you would any other liquid soap.  You can apply chg directly  to the skin and wash                       Gently with a scrungie or clean washcloth.  5.  Apply the CHG Soap to your body ONLY FROM THE NECK DOWN.   Do not use on face/ open                           Wound or open sores. Avoid contact with eyes, ears mouth and genitals (private parts).                       Wash face,  Genitals (private parts) with your normal soap.             6.  Wash thoroughly, paying special attention to the area where your surgery  will be performed.  7.  Thoroughly rinse your body with warm water from the neck down.  8.  DO NOT shower/wash with your normal soap after using and rinsing off  the CHG Soap.                9.  Pat yourself dry with a clean towel.            10.  Wear clean pajamas.            11.  Place clean sheets on your bed the night of your first shower and do not  sleep with pets. Day of Surgery : Do not apply any lotions/deodorants the morning of surgery.  Please wear  clean clothes to the hospital/surgery center.  FAILURE TO FOLLOW THESE INSTRUCTIONS MAY RESULT IN THE CANCELLATION OF YOUR SURGERY PATIENT SIGNATURE_________________________________  NURSE SIGNATURE__________________________________  ________________________________________________________________________

## 2019-07-25 NOTE — Progress Notes (Signed)
PCP - Tandy Gaw, [facility] Cardiologist -   Chest x-ray -  EKG -  Stress Test -  ECHO -  Cardiac Cath -   Sleep Study -  CPAP -   Fasting Blood Sugar -  Checks Blood Sugar _____ times a day  Blood Thinner Instructions: Aspirin Instructions: Last Dose:  Anesthesia review:   Patient denies shortness of breath, fever, cough and chest pain at PAT appointment   Patient verbalized understanding of instructions that were given to them at the PAT appointment. Patient was also instructed that they will need to review over the PAT instructions again at home before surgery.

## 2019-07-28 ENCOUNTER — Other Ambulatory Visit: Payer: Self-pay

## 2019-07-28 ENCOUNTER — Other Ambulatory Visit (HOSPITAL_COMMUNITY)
Admission: RE | Admit: 2019-07-28 | Discharge: 2019-07-28 | Disposition: A | Discharge status: Home / Self Care | Payer: BC Managed Care – PPO | Source: Ambulatory Visit | Attending: Surgery | Admitting: Surgery

## 2019-07-28 ENCOUNTER — Encounter (HOSPITAL_COMMUNITY)
Admission: RE | Admit: 2019-07-28 | Discharge: 2019-07-28 | Disposition: A | Discharge status: Home / Self Care | Payer: BC Managed Care – PPO | Source: Ambulatory Visit | Attending: Surgery | Admitting: Surgery

## 2019-07-28 ENCOUNTER — Encounter (HOSPITAL_COMMUNITY): Payer: Self-pay

## 2019-07-28 DIAGNOSIS — Z20822 Contact with and (suspected) exposure to covid-19: Secondary | ICD-10-CM | POA: Insufficient documentation

## 2019-07-28 DIAGNOSIS — Z01812 Encounter for preprocedural laboratory examination: Secondary | ICD-10-CM | POA: Insufficient documentation

## 2019-07-28 LAB — CBC
HCT: 43.7 % (ref 39.0–52.0)
Hemoglobin: 14.3 g/dL (ref 13.0–17.0)
MCH: 29.4 pg (ref 26.0–34.0)
MCHC: 32.7 g/dL (ref 30.0–36.0)
MCV: 89.9 fL (ref 80.0–100.0)
Platelets: 284 10*3/uL (ref 150–400)
RBC: 4.86 MIL/uL (ref 4.22–5.81)
RDW: 11.9 % (ref 11.5–15.5)
WBC: 5.1 10*3/uL (ref 4.0–10.5)
nRBC: 0 % (ref 0.0–0.2)

## 2019-07-29 LAB — SARS CORONAVIRUS 2 (TAT 6-24 HRS): SARS Coronavirus 2: NEGATIVE

## 2019-07-31 ENCOUNTER — Other Ambulatory Visit: Payer: Self-pay

## 2019-07-31 ENCOUNTER — Ambulatory Visit (HOSPITAL_COMMUNITY)
Admission: RE | Admit: 2019-07-31 | Discharge: 2019-07-31 | Disposition: A | Discharge status: Home / Self Care | Payer: BC Managed Care – PPO | Attending: Surgery | Admitting: Surgery

## 2019-07-31 ENCOUNTER — Encounter (HOSPITAL_COMMUNITY)
Admission: RE | Disposition: A | Discharge status: Home / Self Care | Payer: Self-pay | Source: Home / Self Care | Attending: Surgery

## 2019-07-31 ENCOUNTER — Ambulatory Visit (HOSPITAL_COMMUNITY): Payer: BC Managed Care – PPO | Admitting: Certified Registered Nurse Anesthetist

## 2019-07-31 ENCOUNTER — Encounter (HOSPITAL_COMMUNITY): Payer: Self-pay | Admitting: Surgery

## 2019-07-31 DIAGNOSIS — E7800 Pure hypercholesterolemia, unspecified: Secondary | ICD-10-CM | POA: Diagnosis not present

## 2019-07-31 DIAGNOSIS — L0591 Pilonidal cyst without abscess: Secondary | ICD-10-CM | POA: Diagnosis not present

## 2019-07-31 DIAGNOSIS — J45909 Unspecified asthma, uncomplicated: Secondary | ICD-10-CM | POA: Diagnosis not present

## 2019-07-31 DIAGNOSIS — L0501 Pilonidal cyst with abscess: Secondary | ICD-10-CM | POA: Diagnosis not present

## 2019-07-31 DIAGNOSIS — K4090 Unilateral inguinal hernia, without obstruction or gangrene, not specified as recurrent: Secondary | ICD-10-CM | POA: Diagnosis not present

## 2019-07-31 HISTORY — PX: PILONIDAL CYST EXCISION: SHX744

## 2019-07-31 SURGERY — EXCISION, SIMPLE PILONIDAL CYST
Anesthesia: General

## 2019-07-31 MED ORDER — LIDOCAINE 2% (20 MG/ML) 5 ML SYRINGE
INTRAMUSCULAR | Status: AC
  Filled 2019-07-31: qty 5

## 2019-07-31 MED ORDER — OXYCODONE HCL 5 MG PO TABS
ORAL_TABLET | ORAL | Status: AC
  Filled 2019-07-31: qty 1

## 2019-07-31 MED ORDER — BUPIVACAINE HCL 0.25 % IJ SOLN
INTRAMUSCULAR | Status: AC
  Filled 2019-07-31: qty 1

## 2019-07-31 MED ORDER — ROCURONIUM BROMIDE 50 MG/5ML IV SOSY
PREFILLED_SYRINGE | INTRAVENOUS | Status: DC | PRN
  Administered 2019-07-31: 50 mg via INTRAVENOUS

## 2019-07-31 MED ORDER — CHLORHEXIDINE GLUCONATE CLOTH 2 % EX PADS: 6.0000 | MEDICATED_PAD | Freq: Once | CUTANEOUS | Status: DC

## 2019-07-31 MED ORDER — OXYCODONE HCL 5 MG PO TABS
5.0000 mg | ORAL_TABLET | Freq: Once | ORAL | Status: AC | PRN
  Administered 2019-07-31: 5 mg via ORAL

## 2019-07-31 MED ORDER — OXYCODONE HCL 5 MG PO TABS: 5.0000 mg | ORAL_TABLET | Freq: Four times a day (QID) | ORAL | 0 refills | Status: DC | PRN

## 2019-07-31 MED ORDER — BUPIVACAINE HCL 0.25 % IJ SOLN
INTRAMUSCULAR | Status: DC | PRN
  Administered 2019-07-31: 10 mL

## 2019-07-31 MED ORDER — PROPOFOL 10 MG/ML IV BOLUS
INTRAVENOUS | Status: DC | PRN
  Administered 2019-07-31: 200 mg via INTRAVENOUS

## 2019-07-31 MED ORDER — LIDOCAINE 2% (20 MG/ML) 5 ML SYRINGE
INTRAMUSCULAR | Status: DC | PRN
  Administered 2019-07-31: 60 mg via INTRAVENOUS

## 2019-07-31 MED ORDER — LACTATED RINGERS IV SOLN: INTRAVENOUS | Status: DC

## 2019-07-31 MED ORDER — ONDANSETRON HCL 4 MG/2ML IJ SOLN
INTRAMUSCULAR | Status: AC
  Filled 2019-07-31: qty 2

## 2019-07-31 MED ORDER — PROMETHAZINE HCL 25 MG/ML IJ SOLN: 6.2500 mg | INTRAMUSCULAR | Status: DC | PRN

## 2019-07-31 MED ORDER — HYDROMORPHONE HCL 1 MG/ML IJ SOLN: 0.2500 mg | INTRAMUSCULAR | Status: DC | PRN

## 2019-07-31 MED ORDER — MIDAZOLAM HCL 5 MG/5ML IJ SOLN
INTRAMUSCULAR | Status: DC | PRN
  Administered 2019-07-31 (×2): 1 mg via INTRAVENOUS

## 2019-07-31 MED ORDER — FENTANYL CITRATE (PF) 250 MCG/5ML IJ SOLN
INTRAMUSCULAR | Status: AC
  Filled 2019-07-31: qty 5

## 2019-07-31 MED ORDER — ROCURONIUM BROMIDE 10 MG/ML (PF) SYRINGE
PREFILLED_SYRINGE | INTRAVENOUS | Status: AC
  Filled 2019-07-31: qty 10

## 2019-07-31 MED ORDER — OXYCODONE HCL 5 MG/5ML PO SOLN: 5.0000 mg | Freq: Once | ORAL | Status: AC | PRN

## 2019-07-31 MED ORDER — DEXAMETHASONE SODIUM PHOSPHATE 10 MG/ML IJ SOLN
INTRAMUSCULAR | Status: DC | PRN
  Administered 2019-07-31: 7 mg via INTRAVENOUS

## 2019-07-31 MED ORDER — CEFAZOLIN SODIUM-DEXTROSE 2-4 GM/100ML-% IV SOLN
2.0000 g | INTRAVENOUS | Status: AC
  Administered 2019-07-31: 2 g via INTRAVENOUS
  Filled 2019-07-31: qty 100

## 2019-07-31 MED ORDER — DEXAMETHASONE SODIUM PHOSPHATE 10 MG/ML IJ SOLN
INTRAMUSCULAR | Status: AC
  Filled 2019-07-31: qty 1

## 2019-07-31 MED ORDER — FENTANYL CITRATE (PF) 100 MCG/2ML IJ SOLN
INTRAMUSCULAR | Status: DC | PRN
  Administered 2019-07-31: 50 ug via INTRAVENOUS

## 2019-07-31 MED ORDER — SUGAMMADEX SODIUM 200 MG/2ML IV SOLN
INTRAVENOUS | Status: DC | PRN
  Administered 2019-07-31: 160 mg via INTRAVENOUS

## 2019-07-31 MED ORDER — MIDAZOLAM HCL 2 MG/2ML IJ SOLN
INTRAMUSCULAR | Status: AC
  Filled 2019-07-31: qty 2

## 2019-07-31 MED ORDER — 0.9 % SODIUM CHLORIDE (POUR BTL) OPTIME
TOPICAL | Status: DC | PRN
  Administered 2019-07-31: 1000 mL

## 2019-07-31 MED ORDER — MEPERIDINE HCL 50 MG/ML IJ SOLN: 6.2500 mg | INTRAMUSCULAR | Status: DC | PRN

## 2019-07-31 MED ORDER — ONDANSETRON HCL 4 MG/2ML IJ SOLN
INTRAMUSCULAR | Status: DC | PRN
  Administered 2019-07-31: 4 mg via INTRAVENOUS

## 2019-07-31 MED ORDER — PROPOFOL 10 MG/ML IV BOLUS
INTRAVENOUS | Status: AC
  Filled 2019-07-31: qty 20

## 2019-07-31 SURGICAL SUPPLY — 29 items
BENZOIN TINCTURE PRP APPL 2/3 (GAUZE/BANDAGES/DRESSINGS) ×2 IMPLANT
BLADE HEX COATED 2.75 (ELECTRODE) ×2 IMPLANT
BRIEF STRETCH FOR OB PAD LRG (UNDERPADS AND DIAPERS) ×2 IMPLANT
COVER SURGICAL LIGHT HANDLE (MISCELLANEOUS) ×2 IMPLANT
COVER WAND RF STERILE (DRAPES) IMPLANT
DRAIN PENROSE 0.5X18 (DRAIN) ×2 IMPLANT
DRAPE UTILITY XL STRL (DRAPES) ×2 IMPLANT
ELECT REM PT RETURN 15FT ADLT (MISCELLANEOUS) ×2 IMPLANT
GAUZE SPONGE 4X4 12PLY STRL (GAUZE/BANDAGES/DRESSINGS) ×2 IMPLANT
GLOVE BIO SURGEON STRL SZ7 (GLOVE) ×2 IMPLANT
GLOVE BIOGEL PI IND STRL 7.0 (GLOVE) ×1 IMPLANT
GLOVE BIOGEL PI IND STRL 7.5 (GLOVE) ×1 IMPLANT
GLOVE BIOGEL PI INDICATOR 7.0 (GLOVE) ×1
GLOVE BIOGEL PI INDICATOR 7.5 (GLOVE) ×1
GOWN STRL REUS W/TWL LRG LVL3 (GOWN DISPOSABLE) ×4 IMPLANT
GOWN STRL REUS W/TWL XL LVL3 (GOWN DISPOSABLE) ×2 IMPLANT
KIT BASIN OR (CUSTOM PROCEDURE TRAY) ×2 IMPLANT
KIT TURNOVER KIT A (KITS) IMPLANT
NS IRRIG 1000ML POUR BTL (IV SOLUTION) ×2 IMPLANT
PACK BASIC VI WITH GOWN DISP (CUSTOM PROCEDURE TRAY) ×2 IMPLANT
PENCIL SMOKE EVACUATOR (MISCELLANEOUS) IMPLANT
SPONGE LAP 4X18 RFD (DISPOSABLE) ×2 IMPLANT
SUT ETHILON 3 0 PS 1 (SUTURE) ×1 IMPLANT
SUT NYLON 3 0 (SUTURE) IMPLANT
SUT VIC AB 3-0 SH 18 (SUTURE) ×1 IMPLANT
TAPE CLOTH 4X10 WHT NS (GAUZE/BANDAGES/DRESSINGS) ×2 IMPLANT
TAPE CLOTH SURG 4X10 WHT LF (GAUZE/BANDAGES/DRESSINGS) ×1 IMPLANT
TOWEL OR 17X26 10 PK STRL BLUE (TOWEL DISPOSABLE) ×4 IMPLANT
YANKAUER SUCT BULB TIP 10FT TU (MISCELLANEOUS) ×2 IMPLANT

## 2019-07-31 NOTE — Transfer of Care (Signed)
Immediate Anesthesia Transfer of Care Note  Patient: Troy Estrada  Procedure(s) Performed: PILONIDAL CYSTECTOMY (N/A )  Patient Location: PACU  Anesthesia Type:General  Level of Consciousness: awake, alert  and patient cooperative  Airway & Oxygen Therapy: Patient Spontanous Breathing and Patient connected to face mask oxygen  Post-op Assessment: Report given to RN and Post -op Vital signs reviewed and stable  Post vital signs: Reviewed and stable  Last Vitals:  Vitals Value Taken Time  BP 127/77 07/31/19 1023  Temp 36.4 C 07/31/19 1023  Pulse 64 07/31/19 1027  Resp 15 07/31/19 1027  SpO2 100 % 07/31/19 1027  Vitals shown include unvalidated device data.  Last Pain:  Vitals:   07/31/19 0740  TempSrc: Oral      Patients Stated Pain Goal: 3 (07/31/19 0752)  Complications: No apparent anesthesia complications

## 2019-07-31 NOTE — Interval H&P Note (Signed)
History and Physical Interval Note:  07/31/2019 7:29 AM  Troy Estrada  has presented today for surgery, with the diagnosis of CHRONIC PILONIDAL CYST.  The various methods of treatment have been discussed with the patient and family. After consideration of risks, benefits and other options for treatment, the patient has consented to  Procedure(s): PILONIDAL CYSTECTOMY (N/A) as a surgical intervention.  The patient's history has been reviewed, patient examined, no change in status, stable for surgery.  I have reviewed the patient's chart and labs.  Questions were answered to the patient's satisfaction.     Troy Estrada

## 2019-07-31 NOTE — Op Note (Signed)
Preop diagnosis: Chronic pilonidal cyst Postop diagnosis: Same Procedure performed: Pilonidal cystectomy Surgeon:Javious Hallisey K Amely Voorheis Anesthesia: General Indications: This is a 29 year old male who presents with a 47-month history of intermittent pain swelling and drainage near his tailbone.  Currently there is no active abscess but he does have 2 very obvious skin pits with some firmness underneath.  He presents now for excision to hopefully prevent future abscesses.  Description of procedure: The patient is brought to the operating room and placed in the supine position on the stretcher.  After an adequate level general anesthesia was obtained, he was moved to a prone position on the operating table.  His lower back and buttocks were prepped with Betadine and draped in sterile fashion.  A timeout was taken to ensure the proper patient and proper procedure.  The patient has 2 skin pits with hair protruding from 1 of these.  There is no sign of abscess at this time.  No purulent drainage is noted.  I made an elliptical incision about 2 cm long to excise both of these skin pits.  We dissected down into the subcutaneous tissue with cautery.  I excised the entire cyst back to healthy-appearing adipose tissue.  There are some clumps of hair within the cyst.  There is a small tunnel tracking inferiorly there was also excised.  We irrigated the wound thoroughly.  Cautery was used for hemostasis.  The wound was closed with multiple interrupted deep 3-0 Vicryl sutures.  3-0 Ethilon sutures were used to reapproximate the edges of the skin.  A dry dressing is applied.  The patient was then moved back to a supine position on the stretcher.  He was extubated and brought to the recovery room stable condition.  All sponge, instrument, and needle counts are correct.  Wilmon Arms. Corliss Skains, MD, [facility]  General/ Trauma Surgery   07/31/2019 10:20 AM

## 2019-07-31 NOTE — Anesthesia Procedure Notes (Signed)
Procedure Name: Intubation Date/Time: 07/31/2019 9:29 AM Performed by: West Pugh, CRNA Pre-anesthesia Checklist: Patient identified, Emergency Drugs available, Suction available, Patient being monitored and Timeout performed Patient Re-evaluated:Patient Re-evaluated prior to induction Oxygen Delivery Method: Circle system utilized Preoxygenation: Pre-oxygenation with 100% oxygen Induction Type: IV induction Ventilation: Mask ventilation without difficulty Laryngoscope Size: Mac and 4 Grade View: Grade I Tube type: Oral Tube size: 7.5 mm Number of attempts: 1 Airway Equipment and Method: Stylet Placement Confirmation: ETT inserted through vocal cords under direct vision,  positive ETCO2,  CO2 detector and breath sounds checked- equal and bilateral Secured at: 23 cm Tube secured with: Tape Dental Injury: Teeth and Oropharynx as per pre-operative assessment

## 2019-07-31 NOTE — Anesthesia Preprocedure Evaluation (Signed)
Anesthesia Evaluation  Patient identified by MRN, date of birth, ID band Patient awake    Reviewed: Allergy & Precautions, NPO status , Patient's Chart, lab work & pertinent test results  Airway Mallampati: I  TM Distance: >3 FB Neck ROM: Full    Dental no notable dental hx. (+) Dental Advisory Given, Teeth Intact   Pulmonary asthma ,    Pulmonary exam normal breath sounds clear to auscultation       Cardiovascular negative cardio ROS Normal cardiovascular exam Rhythm:Regular Rate:Normal     Neuro/Psych negative neurological ROS  negative psych ROS   GI/Hepatic negative GI ROS, Neg liver ROS,   Endo/Other  negative endocrine ROS  Renal/GU negative Renal ROS     Musculoskeletal negative musculoskeletal ROS (+)   Abdominal   Peds  Hematology negative hematology ROS (+)   Anesthesia Other Findings   Reproductive/Obstetrics                             Anesthesia Physical Anesthesia Plan  ASA: II  Anesthesia Plan: General   Post-op Pain Management:    Induction: Intravenous  PONV Risk Score and Plan: 3 and Dexamethasone, Ondansetron, Midazolam and Treatment may vary due to age or medical condition  Airway Management Planned: Oral ETT  Additional Equipment: None  Intra-op Plan:   Post-operative Plan: Extubation in OR  Informed Consent: I have reviewed the patients History and Physical, chart, labs and discussed the procedure including the risks, benefits and alternatives for the proposed anesthesia with the patient or authorized representative who has indicated his/her understanding and acceptance.     Dental advisory given  Plan Discussed with: CRNA  Anesthesia Plan Comments:         Anesthesia Quick Evaluation

## 2019-07-31 NOTE — Discharge Instructions (Signed)
[place] Office Phone Number [phone]  POST OP INSTRUCTIONS  Always review your discharge instruction sheet given to you by the facility where your surgery was performed.  IF YOU HAVE DISABILITY OR FAMILY LEAVE FORMS, YOU MUST BRING THEM TO THE OFFICE FOR PROCESSING.  DO NOT GIVE THEM TO YOUR DOCTOR.  1. A prescription for pain medication may be given to you upon discharge.  Take your pain medication as prescribed, if needed.  If narcotic pain medicine is not needed, then you may take acetaminophen (Tylenol) or ibuprofen (Advil) as needed. 2. Take your usually prescribed medications unless otherwise directed 3. If you need a refill on your pain medication, please contact your pharmacy.  They will contact our office to request authorization.  Prescriptions will not be filled after 5pm or on week-ends. 4. You should eat very light the first 24 hours after surgery, such as soup, crackers, pudding, etc.  Resume your normal diet the day after surgery. 5. Most patients will experience some swelling and bruising around the surgical site.  Ice packs will help.  Swelling and bruising can take several days to resolve.  6. It is common to experience some constipation if taking pain medication after surgery.  Increasing fluid intake and taking a stool softener will usually help or prevent this problem from occurring.  A mild laxative (Milk of Magnesia or Miralax) should be taken according to package directions if there are no bowel movements after 48 hours. 7. You may shower after 24 hours.  Change the dressing after each shower - dry gauze and tape. 8. ACTIVITIES:  You may resume regular daily activities (gradually increasing) beginning the next day.   You may have sexual intercourse when it is comfortable. a. You may drive when you no longer are taking prescription pain medication, you can comfortably wear a seatbelt, and you can safely maneuver your car and apply brakes. b. RETURN TO  WORK:  1-2 weeks 9. You should see your doctor in the office for a follow-up appointment approximately two to three weeks after your surgery.    WHEN TO CALL YOUR DOCTOR: 1. Fever over 101.0 2. Nausea and/or vomiting. 3. Extreme swelling or bruising. 4. Continued bleeding from incision. 5. Increased pain, redness, or drainage from the incision.  The clinic staff is available to answer your questions during regular business hours.  Please dont hesitate to call and ask to speak to one of the nurses for clinical concerns.  If you have a medical emergency, go to the nearest emergency room or call 911.  A surgeon from [facility] is always on call at the hospital.  For further questions, please visit [email or web address]    General Anesthesia, Adult, Care After This sheet gives you information about how to care for yourself after your procedure. Your health care provider may also give you more specific instructions. If you have problems or questions, contact your health care provider. What can I expect after the procedure? After the procedure, the following side effects are common:  Pain or discomfort at the IV site.  Nausea.  Vomiting.  Sore throat.  Trouble concentrating.  Feeling cold or chills.  Weak or tired.  Sleepiness and fatigue.  Soreness and body aches. These side effects can affect parts of the body that were not involved in surgery. Follow these instructions at home:  For at least 24 hours after the procedure:  Have a responsible adult stay with you. It is important to have someone help  care for you until you are awake and alert.  Rest as needed.  Do not: ? Participate in activities in which you could fall or become injured. ? Drive. ? Use heavy machinery. ? Drink alcohol. ? Take sleeping pills or medicines that cause drowsiness. ? Make important decisions or sign legal documents. ? Take care of children on your own. Eating and  drinking  Follow any instructions from your health care provider about eating or drinking restrictions.  When you feel hungry, start by eating small amounts of foods that are soft and easy to digest (bland), such as toast. Gradually return to your regular diet.  Drink enough fluid to keep your urine pale yellow.  If you vomit, rehydrate by drinking water, juice, or clear broth. General instructions  If you have sleep apnea, surgery and certain medicines can increase your risk for breathing problems. Follow instructions from your health care provider about wearing your sleep device: ? Anytime you are sleeping, including during daytime naps. ? While taking prescription pain medicines, sleeping medicines, or medicines that make you drowsy.  Return to your normal activities as told by your health care provider. Ask your health care provider what activities are safe for you.  Take over-the-counter and prescription medicines only as told by your health care provider.  If you smoke, do not smoke without supervision.  Keep all follow-up visits as told by your health care provider. This is important. Contact a health care provider if:  You have nausea or vomiting that does not get better with medicine.  You cannot eat or drink without vomiting.  You have pain that does not get better with medicine.  You are unable to pass urine.  You develop a skin rash.  You have a fever.  You have redness around your IV site that gets worse. Get help right away if:  You have difficulty breathing.  You have chest pain.  You have blood in your urine or stool, or you vomit blood. Summary  After the procedure, it is common to have a sore throat or nausea. It is also common to feel tired.  Have a responsible adult stay with you for the first 24 hours after general anesthesia. It is important to have someone help care for you until you are awake and alert.  When you feel hungry, start by eating  small amounts of foods that are soft and easy to digest (bland), such as toast. Gradually return to your regular diet.  Drink enough fluid to keep your urine pale yellow.  Return to your normal activities as told by your health care provider. Ask your health care provider what activities are safe for you. This information is not intended to replace advice given to you by your health care provider. Make sure you discuss any questions you have with your health care provider. Document Revised: 04/27/2017 Document Reviewed: 12/08/2016 Elsevier Patient Education  2020 [employer].

## 2019-08-01 ENCOUNTER — Encounter: Payer: Self-pay | Admitting: *Deleted

## 2019-08-01 LAB — SURGICAL PATHOLOGY

## 2019-08-01 NOTE — Anesthesia Postprocedure Evaluation (Signed)
Anesthesia Post Note  Patient: Troy Estrada  Procedure(s) Performed: PILONIDAL CYSTECTOMY (N/A )     Patient location during evaluation: PACU Anesthesia Type: General Level of consciousness: awake and alert Pain management: pain level controlled Vital Signs Assessment: post-procedure vital signs reviewed and stable Respiratory status: spontaneous breathing, nonlabored ventilation, respiratory function stable and patient connected to nasal cannula oxygen Cardiovascular status: blood pressure returned to baseline and stable Postop Assessment: no apparent nausea or vomiting Anesthetic complications: no    Last Vitals:  Vitals:   07/31/19 1045 07/31/19 1115  BP: 115/77 123/78  Pulse: (!) 58 (!) 55  Resp: 14 16  Temp: 36.5 C 36.4 C  SpO2: 98% 100%    Last Pain:  Vitals:   07/31/19 1115  TempSrc:   PainSc: 3                  Kennieth Rad

## 2019-12-05 DIAGNOSIS — Z03818 Encounter for observation for suspected exposure to other biological agents ruled out: Secondary | ICD-10-CM | POA: Diagnosis not present

## 2019-12-05 DIAGNOSIS — Z20822 Contact with and (suspected) exposure to covid-19: Secondary | ICD-10-CM | POA: Diagnosis not present

## 2020-01-27 DIAGNOSIS — Z20822 Contact with and (suspected) exposure to covid-19: Secondary | ICD-10-CM | POA: Diagnosis not present

## 2020-06-07 ENCOUNTER — Ambulatory Visit: Payer: BC Managed Care – PPO | Admitting: Dermatology

## 2020-06-07 ENCOUNTER — Other Ambulatory Visit: Payer: Self-pay

## 2020-06-07 ENCOUNTER — Encounter: Payer: Self-pay | Admitting: Dermatology

## 2020-06-07 DIAGNOSIS — L814 Other melanin hyperpigmentation: Secondary | ICD-10-CM

## 2020-06-07 DIAGNOSIS — Z1283 Encounter for screening for malignant neoplasm of skin: Secondary | ICD-10-CM

## 2020-06-07 DIAGNOSIS — L720 Epidermal cyst: Secondary | ICD-10-CM

## 2020-06-12 ENCOUNTER — Encounter: Payer: Self-pay | Admitting: Dermatology

## 2020-06-12 NOTE — Progress Notes (Signed)
   New Patient   Subjective  Davaughn Hillyard is a 30 y.o. male who presents for the following: Annual Exam (No new concerns- ?birthmark on scalp).  General skin examination Location:  Duration:  Quality:  Associated Signs/Symptoms: Modifying Factors:  Severity:  Timing: Context:    The following portions of the chart were reviewed this encounter and updated as appropriate:  Tobacco  Allergies  Meds  Problems  Med Hx  Surg Hx  Fam Hx      Objective  Well appearing patient in no apparent distress; mood and affect are within normal limits. Objective  Chest - Medial [facility]): Head to toe examination, clear exam  65mm mole right parietal 42mm lentigo right dorsal foot, normal dermoscopy   Objective  Right Supraorbital Region: 1 milia white dermal papule right upper lid    A full examination was performed including scalp, head, eyes, ears, nose, lips, neck, chest, axillae, abdomen, back, buttocks, bilateral upper extremities, bilateral lower extremities, hands, feet, fingers, toes, fingernails, and toenails. All findings within normal limits unless otherwise noted below.   Assessment & Plan  Screening exam for skin cancer Chest - Medial [facility])  Self examined with spouse twice annually, as needed dermatologist examination every 1 to 3 years.  Continue ultraviolet protection.  Milia Right Supraorbital Region  Patient may choose future removal.

## 2020-08-19 ENCOUNTER — Telehealth (INDEPENDENT_AMBULATORY_CARE_PROVIDER_SITE_OTHER): Payer: BC Managed Care – PPO | Admitting: Physician Assistant

## 2020-08-19 DIAGNOSIS — R0989 Other specified symptoms and signs involving the circulatory and respiratory systems: Secondary | ICD-10-CM | POA: Insufficient documentation

## 2020-08-19 DIAGNOSIS — R221 Localized swelling, mass and lump, neck: Secondary | ICD-10-CM | POA: Insufficient documentation

## 2020-08-19 DIAGNOSIS — J302 Other seasonal allergic rhinitis: Secondary | ICD-10-CM

## 2020-08-19 DIAGNOSIS — J3489 Other specified disorders of nose and nasal sinuses: Secondary | ICD-10-CM | POA: Diagnosis not present

## 2020-08-19 MED ORDER — FLUTICASONE PROPIONATE 50 MCG/ACT NA SUSP: 2.0000 | Freq: Every day | NASAL | 0 refills | Status: DC

## 2020-08-19 MED ORDER — METHYLPREDNISOLONE 4 MG PO TBPK: ORAL_TABLET | ORAL | 0 refills | Status: DC

## 2020-08-19 NOTE — Progress Notes (Signed)
...Virtual Visit via Video Note  I connected with Fannie Knee on 08/19/20 at  8:30 AM EDT by a video enabled telemedicine application and verified that I am speaking with the correct person using two identifiers.  Location: Patient: work Provider: clinic  .Marland KitchenParticipating in visit:  Patient: Maika Provider: Tandy Gaw PA-C   I discussed the limitations of evaluation and management by telemedicine and the availability of in person appointments. The patient expressed understanding and agreed to proceed.  History of Present Illness: Patient is a 30 year old male with a history of intermittent allergies who presents to the clinic with concerning symptoms.  Sunday he started with a headache and congestion.  Monday he continues with headache and some nasal and sinus congestion. Tuesday he had a tickle in throat and then felt like a pill was stuck in throat. After drinking a lot of water than went away but then came back this morning. He is eating and drinking. He has not choked. Headache better. Took decongestant and BP spiked. He is sleeping fine. No cough, SOB, wheezing. His wife is concerned about his thyroid. He has not had any issues like this until the week. He is taking coriciden.   No fever, chills, body aches, SOB, cough.     .. Active Ambulatory Problems    Diagnosis Date Noted  . Exercise-induced asthma 11/24/2015  . Elevated LDL cholesterol level 12/05/2015  . Pilonidal cyst with abscess 09/06/2018  . Hernia, inguinal, right 05/14/2019  . Pilonidal cyst 05/14/2019  . Sensation of lump in throat 08/19/2020  . Tickle in throat 08/19/2020   Resolved Ambulatory Problems    Diagnosis Date Noted  . No Resolved Ambulatory Problems   No Additional Past Medical History       Observations/Objective: No acute distress Normal mood and appearance No cough or labored breathing  .Marland KitchenThere were no vitals filed for this visit. There is no height or weight on file to calculate  BMI.     Assessment and Plan: Marland KitchenMarland KitchenGarnell was seen today for sore throat.  Diagnoses and all orders for this visit:  Sinus pressure -     fluticasone (FLONASE) 50 MCG/ACT nasal spray; Place 2 sprays into both nostrils daily. -     methylPREDNISolone (MEDROL DOSEPAK) 4 MG TBPK tablet; Take as directed by package insert.  Sensation of lump in throat -     methylPREDNISolone (MEDROL DOSEPAK) 4 MG TBPK tablet; Take as directed by package insert.  Tickle in throat -     fluticasone (FLONASE) 50 MCG/ACT nasal spray; Place 2 sprays into both nostrils daily. -     methylPREDNISolone (MEDROL DOSEPAK) 4 MG TBPK tablet; Take as directed by package insert.  Seasonal allergies -     fluticasone (FLONASE) 50 MCG/ACT nasal spray; Place 2 sprays into both nostrils daily. -     methylPREDNISolone (MEDROL DOSEPAK) 4 MG TBPK tablet; Take as directed by package insert.   Covid negative. Pt is not vaccinated.  Consider testing once more if symptoms worsen.  Sounds more allergic than viral to me.  No signs of bacterial infection.  Start with zyrtec and flonase. If not improving then add medrol dose pack.  If still having throat involvement then will work up thyroid as that is a concern for patient with ultrasound and labs.     Follow Up Instructions:    I discussed the assessment and treatment plan with the patient. The patient was provided an opportunity to ask questions and all were answered. The patient  agreed with the plan and demonstrated an understanding of the instructions.   The patient was advised to call back or seek an in-person evaluation if the symptoms worsen or if the condition fails to improve as anticipated   Tandy Gaw, PA-C

## 2020-08-19 NOTE — Patient Instructions (Signed)
Zyrtec 10mg  at bedtime with flonase 2 sprays each nostril daily. If not improving in 1-2 days then start medrol dose pak. If still not improving will get thyroid ultrasound and labs.

## 2020-09-11 ENCOUNTER — Other Ambulatory Visit: Payer: Self-pay | Admitting: Physician Assistant

## 2020-09-11 DIAGNOSIS — R0989 Other specified symptoms and signs involving the circulatory and respiratory systems: Secondary | ICD-10-CM

## 2020-09-11 DIAGNOSIS — J302 Other seasonal allergic rhinitis: Secondary | ICD-10-CM

## 2020-09-11 DIAGNOSIS — J3489 Other specified disorders of nose and nasal sinuses: Secondary | ICD-10-CM

## 2020-09-23 DIAGNOSIS — Z20822 Contact with and (suspected) exposure to covid-19: Secondary | ICD-10-CM | POA: Diagnosis not present

## 2020-09-28 ENCOUNTER — Other Ambulatory Visit: Payer: Self-pay | Admitting: Physician Assistant

## 2020-09-28 DIAGNOSIS — R0989 Other specified symptoms and signs involving the circulatory and respiratory systems: Secondary | ICD-10-CM

## 2020-09-28 DIAGNOSIS — J3489 Other specified disorders of nose and nasal sinuses: Secondary | ICD-10-CM

## 2020-09-28 DIAGNOSIS — J302 Other seasonal allergic rhinitis: Secondary | ICD-10-CM

## 2020-10-11 ENCOUNTER — Telehealth (INDEPENDENT_AMBULATORY_CARE_PROVIDER_SITE_OTHER): Payer: BC Managed Care – PPO | Admitting: Physician Assistant

## 2020-10-11 ENCOUNTER — Encounter: Payer: Self-pay | Admitting: Physician Assistant

## 2020-10-11 VITALS — Ht 70.0 in | Wt 182.0 lb

## 2020-10-11 DIAGNOSIS — J302 Other seasonal allergic rhinitis: Secondary | ICD-10-CM

## 2020-10-11 DIAGNOSIS — B351 Tinea unguium: Secondary | ICD-10-CM | POA: Diagnosis not present

## 2020-10-11 DIAGNOSIS — L309 Dermatitis, unspecified: Secondary | ICD-10-CM

## 2020-10-11 MED ORDER — TRIAMCINOLONE ACETONIDE 0.1 % EX CREA: 1.0000 "application " | TOPICAL_CREAM | Freq: Two times a day (BID) | CUTANEOUS | 0 refills | Status: DC

## 2020-10-11 MED ORDER — TERBINAFINE HCL 250 MG PO TABS: 250.0000 mg | ORAL_TABLET | Freq: Every day | ORAL | 1 refills | Status: AC

## 2020-10-11 NOTE — Progress Notes (Signed)
Started at the beginning of May Forearms/biceps of both arms Comes and goes Seems activated by heat or scratching Gets itchy but no redness/rash until it is scratched or if he is outside in the heat or in a hot shower

## 2020-10-11 NOTE — Progress Notes (Signed)
Patient ID: Troy Estrada, male   DOB: [birth year], 30 y.o.   MRN: [id number] .Marland KitchenVirtual Visit via Video Note  I connected with Troy Estrada on 10/12/20 at 11:10 AM EDT by a video enabled telemedicine application and verified that I am speaking with the correct person using two identifiers.  Location: Patient: work Provider: clinic  .Marland KitchenParticipating in visit:  Patient: Troy Estrada Provider: Tandy Gaw PA-C   I discussed the limitations of evaluation and management by telemedicine and the availability of in person appointments. The patient expressed understanding and agreed to proceed.  History of Present Illness: Pt is a 30 yo male who presents to the clinic with some concerns of toenail fungus and rash.   He has had the hard and yellow toenails for a while but wears shoes all the time and not bothered him. His dad actually had to have toenail removed and brother resolved with medication. His wife would ike for him to have it treated.   The rash started intermittently at the beginning of may. It is anterior arms around the elbow. Goes from distal humerus to proximal radius/ulnar. It is itchy and no redness until he scratches it. Worse when he gets out of a shower or comes in from being outside. No hives. No new soaps, detergents, lotions. He has been putting out some snake repellent regularly and wonders if that could be his trigger.    .. Active Ambulatory Problems    Diagnosis Date Noted  . Exercise-induced asthma 11/24/2015  . Elevated LDL cholesterol level 12/05/2015  . Pilonidal cyst with abscess 09/06/2018  . Hernia, inguinal, right 05/14/2019  . Pilonidal cyst 05/14/2019  . Sensation of lump in throat 08/19/2020  . Tickle in throat 08/19/2020  . Onychomycosis 10/12/2020  . Seasonal allergies 10/12/2020  . Dermatitis 10/12/2020   Resolved Ambulatory Problems    Diagnosis Date Noted  . No Resolved Ambulatory Problems   No Additional Past Medical History       Observations/Objective: No acute distress Norma mood and appearance No wheezing, coughing or labored breathing.  No active rash.  Sounded a little congested.  Picture of 2nd, 3rd, and 4th metatarsal toenails thick and yellow.   .. Today's Vitals   10/11/20 1046  Weight: 182 lb (82.6 kg)  Height: 5\' 10"  (1.778 m)   Body mass index is 26.11 kg/m.   Assessment and Plan: Marland KitchenKroy was seen today for rash.  Diagnoses and all orders for this visit:  Dermatitis -     triamcinolone cream (KENALOG) 0.1 %; Apply 1 application topically 2 (two) times daily.  Onychomycosis -     terbinafine (LAMISIL) 250 MG tablet; Take 1 tablet (250 mg total) by mouth daily.  Seasonal allergies   Unclear etiology of dermatitis. No active rash today. Ok to use as needed topical steroid. Cool compresses when itching. Start zyrtec or claritin daily for 2 weeks. Use caution with any chemicals and wear gloves. Benadryl if severe breakout. Ok for Troy Estrada for allergies.   Pt declined penlac for fungus. Started lamisil for 3-6 months.     Follow Up Instructions:    I discussed the assessment and treatment plan with the patient. The patient was provided an opportunity to ask questions and all were answered. The patient agreed with the plan and demonstrated an understanding of the instructions.   The patient was advised to call back or seek an in-person evaluation if the symptoms worsen or if the condition fails to improve as anticipated.  [employer], PA-C

## 2020-10-11 NOTE — Patient Instructions (Signed)
Start zyrtec/claritin daily for 2 weeks.  Use triamcinolone cream as needed for topical itching on arms.  Start lamisil for toe nail fungus. May take 3-6 months.   Preventing Toenail Fungus from Recurring   Sanitize your shoes with Mycomist spray or a similar shoe sanitizer spray.  Follow the instructions on the bottle and dry them outside in the sun or with a hairdryer.  We also recommend repeating the sanitization once weekly in shoes you wear most often.   Throw away any shoes you have worn a significant amount without socks-fungus thrives in a warm moist environment and you want to avoid re-infection after your laser procedure   Bleach your socks with regular or color safe bleach   Change your socks regularly to keep your feet clean and dry (especially if you have sweaty feet)-if sweaty feet are a problem, let your doctor know-there is a great lotion that helps with this problem.   Clean your toenail clippers with alcohol before you use them if you do your own toenails and make sure to replace [employer] boards and orange sticks regularly   If you get regular pedicures, bring your own instruments or go to a spa that sterilizes their instruments in an autoclave.

## 2020-10-12 DIAGNOSIS — J302 Other seasonal allergic rhinitis: Secondary | ICD-10-CM | POA: Insufficient documentation

## 2020-10-12 DIAGNOSIS — L309 Dermatitis, unspecified: Secondary | ICD-10-CM | POA: Insufficient documentation

## 2020-10-12 DIAGNOSIS — B351 Tinea unguium: Secondary | ICD-10-CM | POA: Insufficient documentation

## 2020-11-13 DIAGNOSIS — R519 Headache, unspecified: Secondary | ICD-10-CM | POA: Diagnosis not present

## 2020-11-13 DIAGNOSIS — R051 Acute cough: Secondary | ICD-10-CM | POA: Diagnosis not present

## 2020-11-13 DIAGNOSIS — Z20822 Contact with and (suspected) exposure to covid-19: Secondary | ICD-10-CM | POA: Diagnosis not present

## 2020-11-15 ENCOUNTER — Encounter: Payer: Self-pay | Admitting: Physician Assistant

## 2020-11-17 DIAGNOSIS — I959 Hypotension, unspecified: Secondary | ICD-10-CM | POA: Diagnosis not present

## 2020-11-17 DIAGNOSIS — R1111 Vomiting without nausea: Secondary | ICD-10-CM | POA: Diagnosis not present

## 2020-11-17 DIAGNOSIS — U071 COVID-19: Secondary | ICD-10-CM | POA: Diagnosis not present

## 2020-11-17 DIAGNOSIS — T17920A Food in respiratory tract, part unspecified causing asphyxiation, initial encounter: Secondary | ICD-10-CM | POA: Diagnosis not present

## 2020-11-18 ENCOUNTER — Other Ambulatory Visit: Payer: Self-pay

## 2020-11-18 ENCOUNTER — Encounter (HOSPITAL_COMMUNITY): Payer: Self-pay | Admitting: Student

## 2020-11-18 ENCOUNTER — Emergency Department (HOSPITAL_COMMUNITY): Payer: BC Managed Care – PPO

## 2020-11-18 ENCOUNTER — Emergency Department (HOSPITAL_COMMUNITY)
Admission: EM | Admit: 2020-11-18 | Discharge: 2020-11-18 | Disposition: A | Discharge status: Home / Self Care | Payer: BC Managed Care – PPO | Attending: Emergency Medicine | Admitting: Emergency Medicine

## 2020-11-18 ENCOUNTER — Emergency Department (HOSPITAL_COMMUNITY)
Admission: EM | Admit: 2020-11-18 | Discharge: 2020-11-18 | Disposition: A | Discharge status: Home / Self Care | Payer: BC Managed Care – PPO | Source: Home / Self Care | Attending: Emergency Medicine | Admitting: Emergency Medicine

## 2020-11-18 ENCOUNTER — Encounter (HOSPITAL_COMMUNITY): Payer: Self-pay

## 2020-11-18 DIAGNOSIS — U071 COVID-19: Secondary | ICD-10-CM

## 2020-11-18 DIAGNOSIS — T484X5A Adverse effect of expectorants, initial encounter: Secondary | ICD-10-CM | POA: Insufficient documentation

## 2020-11-18 DIAGNOSIS — T50905A Adverse effect of unspecified drugs, medicaments and biological substances, initial encounter: Secondary | ICD-10-CM

## 2020-11-18 DIAGNOSIS — Z79899 Other long term (current) drug therapy: Secondary | ICD-10-CM | POA: Insufficient documentation

## 2020-11-18 DIAGNOSIS — R509 Fever, unspecified: Secondary | ICD-10-CM | POA: Diagnosis not present

## 2020-11-18 DIAGNOSIS — J45909 Unspecified asthma, uncomplicated: Secondary | ICD-10-CM | POA: Insufficient documentation

## 2020-11-18 DIAGNOSIS — F458 Other somatoform disorders: Secondary | ICD-10-CM | POA: Diagnosis not present

## 2020-11-18 DIAGNOSIS — R339 Retention of urine, unspecified: Secondary | ICD-10-CM | POA: Insufficient documentation

## 2020-11-18 DIAGNOSIS — R0981 Nasal congestion: Secondary | ICD-10-CM | POA: Insufficient documentation

## 2020-11-18 DIAGNOSIS — T887XXA Unspecified adverse effect of drug or medicament, initial encounter: Secondary | ICD-10-CM | POA: Diagnosis not present

## 2020-11-18 DIAGNOSIS — R059 Cough, unspecified: Secondary | ICD-10-CM | POA: Diagnosis not present

## 2020-11-18 DIAGNOSIS — R0989 Other specified symptoms and signs involving the circulatory and respiratory systems: Secondary | ICD-10-CM | POA: Diagnosis not present

## 2020-11-18 DIAGNOSIS — Z7951 Long term (current) use of inhaled steroids: Secondary | ICD-10-CM | POA: Insufficient documentation

## 2020-11-18 DIAGNOSIS — R9431 Abnormal electrocardiogram [ECG] [EKG]: Secondary | ICD-10-CM | POA: Diagnosis not present

## 2020-11-18 DIAGNOSIS — R198 Other specified symptoms and signs involving the digestive system and abdomen: Secondary | ICD-10-CM

## 2020-11-18 LAB — CBC WITH DIFFERENTIAL/PLATELET
Abs Immature Granulocytes: 0.01 10*3/uL (ref 0.00–0.07)
Basophils Absolute: 0 10*3/uL (ref 0.0–0.1)
Basophils Relative: 0 %
Eosinophils Absolute: 0 10*3/uL (ref 0.0–0.5)
Eosinophils Relative: 0 %
HCT: 42.8 % (ref 39.0–52.0)
Hemoglobin: 14.3 g/dL (ref 13.0–17.0)
Immature Granulocytes: 0 %
Lymphocytes Relative: 12 %
Lymphs Abs: 0.7 10*3/uL (ref 0.7–4.0)
MCH: 28.9 pg (ref 26.0–34.0)
MCHC: 33.4 g/dL (ref 30.0–36.0)
MCV: 86.5 fL (ref 80.0–100.0)
Monocytes Absolute: 0.4 10*3/uL (ref 0.1–1.0)
Monocytes Relative: 7 %
Neutro Abs: 4.7 10*3/uL (ref 1.7–7.7)
Neutrophils Relative %: 81 %
Platelets: 285 10*3/uL (ref 150–400)
RBC: 4.95 MIL/uL (ref 4.22–5.81)
RDW: 12.1 % (ref 11.5–15.5)
WBC: 5.8 10*3/uL (ref 4.0–10.5)
nRBC: 0 % (ref 0.0–0.2)

## 2020-11-18 LAB — URINALYSIS, ROUTINE W REFLEX MICROSCOPIC
Bilirubin Urine: NEGATIVE
Glucose, UA: NEGATIVE mg/dL
Hgb urine dipstick: NEGATIVE
Ketones, ur: NEGATIVE mg/dL
Leukocytes,Ua: NEGATIVE
Nitrite: NEGATIVE
Protein, ur: NEGATIVE mg/dL
Specific Gravity, Urine: 1.009 (ref 1.005–1.030)
pH: 7 (ref 5.0–8.0)

## 2020-11-18 LAB — COMPREHENSIVE METABOLIC PANEL
ALT: 22 U/L (ref 0–44)
AST: 21 U/L (ref 15–41)
Albumin: 4.7 g/dL (ref 3.5–5.0)
Alkaline Phosphatase: 119 U/L (ref 38–126)
Anion gap: 9 (ref 5–15)
BUN: 15 mg/dL (ref 6–20)
CO2: 27 mmol/L (ref 22–32)
Calcium: 9.3 mg/dL (ref 8.9–10.3)
Chloride: 100 mmol/L (ref 98–111)
Creatinine, Ser: 0.88 mg/dL (ref 0.61–1.24)
GFR, Estimated: >60 mL/min (ref >60)
Glucose, Bld: 117 mg/dL — ABNORMAL HIGH (ref 70–99)
Potassium: 4 mmol/L (ref 3.5–5.1)
Sodium: 136 mmol/L (ref 135–145)
Total Bilirubin: 0.4 mg/dL (ref 0.3–1.2)
Total Protein: 8.3 g/dL — ABNORMAL HIGH (ref 6.5–8.1)

## 2020-11-18 LAB — RESP PANEL BY RT-PCR (FLU A&B, COVID) ARPGX2
Influenza A by PCR: NEGATIVE
Influenza B by PCR: NEGATIVE
SARS Coronavirus 2 by RT PCR: POSITIVE — AB

## 2020-11-18 LAB — RAPID URINE DRUG SCREEN, HOSP PERFORMED
Amphetamines: NOT DETECTED
Barbiturates: NOT DETECTED
Benzodiazepines: NOT DETECTED
Cocaine: NOT DETECTED
Opiates: NOT DETECTED
Tetrahydrocannabinol: NOT DETECTED

## 2020-11-18 MED ORDER — PANTOPRAZOLE SODIUM 20 MG PO TBEC: 20.0000 mg | DELAYED_RELEASE_TABLET | Freq: Every day | ORAL | 0 refills | Status: DC

## 2020-11-18 MED ORDER — LIDOCAINE VISCOUS HCL 2 % MT SOLN
15.0000 mL | Freq: Once | OROMUCOSAL | Status: AC
  Administered 2020-11-18: 15 mL via ORAL
  Filled 2020-11-18: qty 15

## 2020-11-18 MED ORDER — SODIUM CHLORIDE 0.9 % IV BOLUS
1000.0000 mL | Freq: Once | INTRAVENOUS | Status: AC
  Administered 2020-11-18: 1000 mL via INTRAVENOUS

## 2020-11-18 MED ORDER — FLUTICASONE PROPIONATE 50 MCG/ACT NA SUSP: 1.0000 | Freq: Every day | NASAL | 0 refills | Status: DC

## 2020-11-18 MED ORDER — SUCRALFATE 1 G PO TABS: 1.0000 g | ORAL_TABLET | Freq: Three times a day (TID) | ORAL | 0 refills | Status: DC

## 2020-11-18 MED ORDER — ALUM & MAG HYDROXIDE-SIMETH 200-200-20 MG/5ML PO SUSP
30.0000 mL | Freq: Once | ORAL | Status: AC
  Administered 2020-11-18: 30 mL via ORAL
  Filled 2020-11-18: qty 30

## 2020-11-18 MED ORDER — HYDROXYZINE HCL 25 MG PO TABS
25.0000 mg | ORAL_TABLET | Freq: Once | ORAL | Status: AC
  Administered 2020-11-18: 25 mg via ORAL
  Filled 2020-11-18: qty 1

## 2020-11-18 NOTE — ED Triage Notes (Signed)
Pt arrived via ems from home.  Pt reports he began having covid sx on Saturday and then tested positive for covid  on Monday.  Pt reports his pcp told him to take Mucinex for s/sx.  Pt reports the mucinex tablet is now lodged in his airway.  Pt's lungs clear to auscultation per ems.  Pt reports he took the mucinex 27 hours ago.  Pt reports he drank water and it stayed down, talking in full sentences.  Vitals per EMS: 99% ra, 94 HR, BP 144/78.

## 2020-11-18 NOTE — ED Provider Notes (Signed)
[facility] Note   CSN: [id number] Arrival date & time: 11/18/20  0256     History Chief Complaint  Patient presents with   Heartburn    Burning sensation in lungs from swallowing mucinex tablet     Troy Estrada is a 30 y.o. male who presents to the ED with complaints of globus sensation x 2 days. Patient reports covid sxs of congestion and cough for the past 6 days, has positive at home test, started taking mucinex for congestion and when he took a tablet on Tuesday 07/12 he felt like it got stuck on the way down at the suprasternal notch. Has had globus sensation since, tonight he was trying to clear his throat and thinks the mucinex tablet moved into his lungs as he not a burning discomfort in the lungs with dyspnea. Worse in certain positions, dyspnea/burning alleviated some now but remains with globus sensation, improves in certain positions such as lying on his stomach.  Denies fever, vomiting, or hemoptysis. He has since tolerated PO water. He has received his covid vaccine & booster.   HPI     Past Medical History:  Diagnosis Date   Elevated LDL cholesterol level 12/05/2015   Exercise-induced asthma 11/24/2015   Pilonidal cyst with abscess 09/06/2018    Patient Active Problem List   Diagnosis Date Noted   Onychomycosis 10/12/2020   Seasonal allergies 10/12/2020   Dermatitis 10/12/2020   Sensation of lump in throat 08/19/2020   Tickle in throat 08/19/2020   Hernia, inguinal, right 05/14/2019   Pilonidal cyst 05/14/2019   Pilonidal cyst with abscess 09/06/2018   Elevated LDL cholesterol level 12/05/2015   Exercise-induced asthma 11/24/2015    Past Surgical History:  Procedure Laterality Date   PILONIDAL CYST DRAINAGE N/A 09/05/2018   In office I&D   PILONIDAL CYST EXCISION N/A 07/31/2019   Procedure: PILONIDAL CYSTECTOMY;  Surgeon: Manus Rudd, MD;  Location: [facility];  Service: General;  Laterality: N/A;   WISDOM TOOTH  EXTRACTION         Family History  Problem Relation Age of Onset   Cancer Maternal Grandmother        cervical   Diabetes Maternal Grandmother    Hypertension Maternal Grandmother    Alzheimer's disease Maternal Grandmother    Depression Mother    Hyperlipidemia Mother    Hypertension Mother    Depression Father    Diabetes Father    Hyperlipidemia Father    Depression Sister    Depression Brother    Diabetes Maternal Uncle    Cancer Maternal Grandfather        prostate   Hypertension Maternal Grandfather    Dementia Paternal Grandfather    Depression Sister    Bipolar disorder Sister     Social History   Tobacco Use   Smoking status: Never   Smokeless tobacco: Never  Vaping Use   Vaping Use: Never used  Substance Use Topics   Alcohol use: Not Currently    Alcohol/week: 0.0 standard drinks    Comment: Rarely   Drug use: No    Home Medications Prior to Admission medications   Medication Sig Start Date End Date Taking? Authorizing Provider  albuterol (VENTOLIN HFA) 108 (90 Base) MCG/ACT inhaler Inhale 2 puffs into the lungs every 6 (six) hours as needed for wheezing or shortness of breath. 09/23/18   Breeback, Jade L, PA-C  fluticasone (FLONASE) 50 MCG/ACT nasal spray SPRAY 2 SPRAYS INTO EACH NOSTRIL EVERY DAY (NOT  COVERED UNDER INSURANCE) 09/28/20   Breeback, Jade L, PA-C  terbinafine (LAMISIL) 250 MG tablet Take 1 tablet (250 mg total) by mouth daily. 10/11/20 01/03/21  Tandy Gaw L, PA-C  triamcinolone cream (KENALOG) 0.1 % Apply 1 application topically 2 (two) times daily. 10/11/20   Jomarie Longs, PA-C    Allergies    Doxycycline  Review of Systems   Review of Systems  Constitutional:  Negative for fever.  HENT:  Positive for congestion. Negative for ear pain.   Respiratory:  Positive for cough and shortness of breath.   Cardiovascular:  Positive for chest pain.  Gastrointestinal:  Negative for blood in stool and vomiting.  Neurological:  Negative for  syncope.  All other systems reviewed and are negative.  Physical Exam Updated Vital Signs BP (!) 179/107 (BP Location: Left Leg)   Pulse 79   Temp 98.2 F (36.8 C) (Oral)   Resp 17   Ht 5\' 10"  (1.778 m)   Wt 82.6 kg   SpO2 95%   BMI 26.11 kg/m   Physical Exam Vitals and nursing note reviewed.  Constitutional:      General: He is not in acute distress.    Appearance: He is well-developed. He is not toxic-appearing.  HENT:     Head: Normocephalic and atraumatic.     Nose: Congestion present.     Mouth/Throat:     Pharynx: Oropharynx is clear.     Comments: Posterior oropharynx is symmetric appearing. Patient tolerating own secretions without difficulty. No trismus. No drooling. No hot potato voice. No swelling beneath the tongue, submandibular compartment is soft.  Eyes:     General:        Right eye: No discharge.        Left eye: No discharge.     Conjunctiva/sclera: Conjunctivae normal.     Pupils: Pupils are equal, round, and reactive to light.  Neck:     Comments: No obvious palpable masses.  Trachea is midline.  Cardiovascular:     Rate and Rhythm: Normal rate and regular rhythm.  Pulmonary:     Effort: Pulmonary effort is normal. No respiratory distress.     Breath sounds: Normal breath sounds. No stridor. No wheezing, rhonchi or rales.  Abdominal:     General: There is no distension.     Palpations: Abdomen is soft.     Tenderness: There is no abdominal tenderness.  Musculoskeletal:     Cervical back: Normal range of motion and neck supple.  Skin:    General: Skin is warm and dry.     Findings: No rash.  Neurological:     Mental Status: He is alert.     Comments: Clear speech.   Psychiatric:        Behavior: Behavior normal.    ED Results / Procedures / Treatments   Labs (all labs ordered are listed, but only abnormal results are displayed) Labs Reviewed - No data to display  EKG None  Radiology DG Chest 2 View  Result Date:  11/18/2020 CLINICAL DATA:  30 year old male with possible aspiration of a pill. EXAM: CHEST - 2 VIEW COMPARISON:  No priors. FINDINGS: Lung volumes are normal. No consolidative airspace disease. No pleural effusions. No pneumothorax. No pulmonary nodule or mass noted. Pulmonary vasculature and the cardiomediastinal silhouette are within normal limits. IMPRESSION: No radiographic evidence of acute cardiopulmonary disease. Specifically, no radiopaque foreign body identified. Electronically Signed   By: 37 M.D.   On: 11/18/2020 05:28  Procedures Procedures   Medications Ordered in ED Medications  alum & mag hydroxide-simeth (MAALOX/MYLANTA) 200-200-20 MG/5ML suspension 30 mL (30 mLs Oral Given 11/18/20 0540)    And  lidocaine (XYLOCAINE) 2 % viscous mouth solution 15 mL (15 mLs Oral Given 11/18/20 0540)    ED Course  I have reviewed the triage vital signs and the nursing notes.  Pertinent labs & imaging results that were available during my care of the patient were reviewed by me and considered in my medical decision making (see chart for details).    MDM Rules/Calculators/A&P                          Patient presents to the ED with complaints of globus sensation.  Nontoxic, vitals w/ elevated BP initially- normalized.  Exam reassuring, tolerating own secretions without difficulty, airways patent, lungs are clear, there is no stridor or signs of respiratory distress.  Plan for chest x-ray with inspiratory and expiratory PA views as well as a lateral view to look for air trapping to evaluate for foreign body.  Will obtain EKG.  Additional history obtained:  Additional history obtained from chart review & nursing note review.  EKG: no STEMI.  Imaging Studies ordered:  I ordered imaging studies which included CXR w/ inspiratory & expiratory PA views & lateral view, I independently reviewed, formal radiology impression shows:  No radiographic evidence of acute cardiopulmonary  disease. Specifically, no radiopaque foreign body identified.  ED Course:  Patient given GI cocktail in the emergency department with some improvement. Overall reassuring exam and ED evaluation.  No signs of respiratory distress, tolerating p.o without difficulty.  Appears appropriate for discharge home.  Will provide supportive care with Carafate, Protonix, and Flonase.  PCP follow-up. I discussed results, treatment plan, need for follow-up, and return precautions with the patient. Provided opportunity for questions, patient confirmed understanding and is in agreement with plan.   Findings and plan of care discussed with supervising physician Dr. Pilar Plate who is in agreement.   Portions of this note were generated with Scientist, clinical (histocompatibility and immunogenetics). Dictation errors may occur despite best attempts at proofreading.  Final Clinical Impression(s) / ED Diagnoses Final diagnoses:  Globus sensation    Rx / DC Orders ED Discharge Orders          Ordered    sucralfate (CARAFATE) 1 g tablet  3 times daily with meals & bedtime        11/18/20 0604    pantoprazole (PROTONIX) 20 MG tablet  Daily        11/18/20 0604    fluticasone (FLONASE) 50 MCG/ACT nasal spray  Daily        11/18/20 0604             Cherly Anderson, PA-C 11/18/20 1751    Sabas Sous, MD 11/18/20 [id number]

## 2020-11-18 NOTE — Discharge Instructions (Addendum)
You were seen in the emergency department today due to your throat and lungs.  Your chest x-ray did not show findings of any foreign bodies or aspiration.  We are sending home with the following medicines: - Flonase: Use 1 spray per nostril daily to help with congestion and postnasal drip. - Carafate: Take prior to each meal and prior to bedtime to help with irritation of the esophagus/swallowing - Protonix: Take once daily in the morning prior to eating.  We have prescribed you new medication(s) today. Discuss the medications prescribed today with your pharmacist as they can have adverse effects and interactions with your other medicines including over the counter and prescribed medications. Seek medical evaluation if you start to experience new or abnormal symptoms after taking one of these medicines, seek care immediately if you start to experience difficulty breathing, feeling of your throat closing, facial swelling, or rash as these could be indications of a more serious allergic reaction  Please follow-up with your primary care provider within 3 days.  Return to the ER for new or worsening symptoms including but not limited to trouble breathing, new or worsening pain, inability to swallow, inability to keep fluids down, fever, coughing up blood, passing out, or any other concerns.

## 2020-11-18 NOTE — ED Notes (Signed)
Bladder scan completed with 630 mL as highest observed volume.   Patient reports pain in bladder area and when he tries to urinate, pt is aware of order for urine sample, however has not been able to urinate. Pt's wife was on the phone during bladder scan and told this writer that she is very concerned because pt is acting completely different from baseline. Pt's RN Kathlene November) informed of results of bladder scan and updated on pt's and wife's concerns.

## 2020-11-18 NOTE — ED Provider Notes (Signed)
[facility]-EMERGENCY DEPT Provider Note   CSN: [id number] Arrival date & time: 11/18/20  1110     History Chief Complaint  Patient presents with   urine retention    Troy Estrada is a 30 y.o. male.  30 year old male presents with complaint of not feeling well today, history is supplemented by patient's wife on speaker phone.  Patient reports history of exercise-induced asthma, currently on antifungal orally for toenail infection.  Began to feel unwell on Thursday, seen in urgent care and diagnosed with bronchitis and then had a positive at home COVID test. Patient was managing his symptoms at home with Mucinex however last night began writhing on the floor with complaint of burning pain in his body and came to the emergency room.  Patient had a chest x-ray with concern for a Mucinex pill stuck in his throat, x-ray was unremarkable and he was discharged home.  Patient's wife states that he drank a glass of water and then developed burning pain through his lower body and is delirious.  Patient is concerned he has a UTI due to burning abdominal pain and difficulty voiding, unable to void since 6AM today.  Patient's wife states that he is not acting normal.  Patient reports feeling like he had had a stroke earlier because both sides of his face were numb although no numbness to the middle of his face. Feet are numb, arm turned blue earlier, concern for circulatory problems. Fevers at home with max temp 101 (reports normal temp is 97).       Past Medical History:  Diagnosis Date   Elevated LDL cholesterol level 12/05/2015   Exercise-induced asthma 11/24/2015   Pilonidal cyst with abscess 09/06/2018    Patient Active Problem List   Diagnosis Date Noted   Onychomycosis 10/12/2020   Seasonal allergies 10/12/2020   Dermatitis 10/12/2020   Sensation of lump in throat 08/19/2020   Tickle in throat 08/19/2020   Hernia, inguinal, right 05/14/2019   Pilonidal cyst 05/14/2019    Pilonidal cyst with abscess 09/06/2018   Elevated LDL cholesterol level 12/05/2015   Exercise-induced asthma 11/24/2015    Past Surgical History:  Procedure Laterality Date   PILONIDAL CYST DRAINAGE N/A 09/05/2018   In office I&D   PILONIDAL CYST EXCISION N/A 07/31/2019   Procedure: PILONIDAL CYSTECTOMY;  Surgeon: Manus Rudd, MD;  Location: [facility];  Service: General;  Laterality: N/A;   WISDOM TOOTH EXTRACTION         Family History  Problem Relation Age of Onset   Cancer Maternal Grandmother        cervical   Diabetes Maternal Grandmother    Hypertension Maternal Grandmother    Alzheimer's disease Maternal Grandmother    Depression Mother    Hyperlipidemia Mother    Hypertension Mother    Depression Father    Diabetes Father    Hyperlipidemia Father    Depression Sister    Depression Brother    Diabetes Maternal Uncle    Cancer Maternal Grandfather        prostate   Hypertension Maternal Grandfather    Dementia Paternal Grandfather    Depression Sister    Bipolar disorder Sister     Social History   Tobacco Use   Smoking status: Never   Smokeless tobacco: Never  Vaping Use   Vaping Use: Never used  Substance Use Topics   Alcohol use: Not Currently    Alcohol/week: 0.0 standard drinks    Comment: Rarely  Drug use: No    Home Medications Prior to Admission medications   Medication Sig Start Date End Date Taking? Authorizing Provider  albuterol (VENTOLIN HFA) 108 (90 Base) MCG/ACT inhaler Inhale 2 puffs into the lungs every 6 (six) hours as needed for wheezing or shortness of breath. 09/23/18  Yes Breeback, Jade L, PA-C  Dextromethorphan-guaiFENesin (MUCINEX DM MAXIMUM STRENGTH) 60-1200 MG TB12 Take 1 tablet by mouth every 12 (twelve) hours.   Yes [provider]  terbinafine (LAMISIL) 250 MG tablet Take 1 tablet (250 mg total) by mouth daily. 10/11/20 01/03/21 Yes Breeback, Jade L, PA-C  fluticasone (FLONASE) 50 MCG/ACT nasal spray Place 1 spray  into both nostrils daily. 11/18/20   Petrucelli, Samantha R, PA-C  pantoprazole (PROTONIX) 20 MG tablet Take 1 tablet (20 mg total) by mouth daily. 11/18/20   Petrucelli, Samantha R, PA-C  sucralfate (CARAFATE) 1 g tablet Take 1 tablet (1 g total) by mouth 4 (four) times daily -  with meals and at bedtime. 11/18/20   Petrucelli, Samantha R, PA-C    Allergies    Doxycycline  Review of Systems   Review of Systems  Constitutional:  Positive for chills and fever.  HENT:  Positive for congestion.   Respiratory:  Positive for cough. Negative for shortness of breath.   Cardiovascular:  Negative for chest pain.  Gastrointestinal:  Negative for abdominal pain, constipation, diarrhea, nausea and vomiting.  Genitourinary:  Positive for difficulty urinating and dysuria.  Musculoskeletal:  Positive for arthralgias and myalgias.  Skin:  Positive for color change. Negative for wound.  Allergic/Immunologic: Negative for immunocompromised state.  Neurological:  Positive for numbness. Negative for weakness.  Hematological:  Negative for adenopathy.  Psychiatric/Behavioral:  Positive for confusion.   All other systems reviewed and are negative.  Physical Exam Updated Vital Signs BP 127/81   Pulse 64   Temp 97.7 F (36.5 C) (Oral)   Resp 18   Ht 5\' 10"  (1.778 m)   Wt 82.6 kg   SpO2 100%   BMI 26.11 kg/m   Physical Exam Vitals and nursing note reviewed.  Constitutional:      General: He is not in acute distress.    Appearance: He is well-developed. He is not diaphoretic.  HENT:     Head: Normocephalic and atraumatic.  Cardiovascular:     Rate and Rhythm: Normal rate and regular rhythm.     Pulses: Normal pulses.     Heart sounds: Normal heart sounds.  Pulmonary:     Effort: Pulmonary effort is normal.     Breath sounds: Normal breath sounds.  Abdominal:     Palpations: Abdomen is soft.     Tenderness: There is no abdominal tenderness.  Musculoskeletal:        General: No swelling or  tenderness.     Cervical back: No rigidity or tenderness.     Right lower leg: No edema.     Left lower leg: No edema.  Skin:    General: Skin is warm and dry.     Findings: No erythema or rash.  Neurological:     Mental Status: He is alert and oriented to person, place, and time.  Psychiatric:        Mood and Affect: Mood is anxious.        Behavior: Behavior normal.    ED Results / Procedures / Treatments   Labs (all labs ordered are listed, but only abnormal results are displayed) Labs Reviewed  RESP PANEL BY RT-PCR (  FLU A&B, COVID) ARPGX2 - Abnormal; Notable for the following components:      Result Value   SARS Coronavirus 2 by RT PCR POSITIVE (*)    All other components within normal limits  URINALYSIS, ROUTINE W REFLEX MICROSCOPIC - Abnormal; Notable for the following components:   Color, Urine STRAW (*)    All other components within normal limits  COMPREHENSIVE METABOLIC PANEL - Abnormal; Notable for the following components:   Glucose, Bld 117 (*)    Total Protein 8.3 (*)    All other components within normal limits  CBC WITH DIFFERENTIAL/PLATELET  RAPID URINE DRUG SCREEN, HOSP PERFORMED    EKG None  Radiology DG Chest 2 View  Result Date: 11/18/2020 CLINICAL DATA:  30 year old male with possible aspiration of a pill. EXAM: CHEST - 2 VIEW COMPARISON:  No priors. FINDINGS: Lung volumes are normal. No consolidative airspace disease. No pleural effusions. No pneumothorax. No pulmonary nodule or mass noted. Pulmonary vasculature and the cardiomediastinal silhouette are within normal limits. IMPRESSION: No radiographic evidence of acute cardiopulmonary disease. Specifically, no radiopaque foreign body identified. Electronically Signed   By: Trudie Reed M.D.   On: 11/18/2020 05:28   DG Chest Port 1 View  Result Date: 11/18/2020 CLINICAL DATA:  Fever, COVID positive 30 year old male. EXAM: PORTABLE CHEST 1 VIEW COMPARISON:  Previous exam from October 19, 2020.  FINDINGS: Trachea is midline. Cardiomediastinal contours and hilar structures are normal. Lungs are clear.  No pneumothorax. On limited assessment no acute skeletal process. IMPRESSION: No acute cardiopulmonary disease.  Is Electronically Signed   By: Donzetta Kohut M.D.   On: 11/18/2020 14:20    Procedures Procedures   Medications Ordered in ED Medications  sodium chloride 0.9 % bolus 1,000 mL (0 mLs Intravenous Stopped 11/18/20 1424)  hydrOXYzine (ATARAX/VISTARIL) tablet 25 mg (25 mg Oral Given 11/18/20 1510)    ED Course  I have reviewed the triage vital signs and the nursing notes.  Pertinent labs & imaging results that were available during my care of the patient were reviewed by me and considered in my medical decision making (see chart for details).  Clinical Course as of 11/18/20 1516  Thu Nov 18, 2020  5072 30 year old male brought in from home with complaint as above. Describes a burning sensation from his chest down his body when he needs to void.  Patient is able to void without difficulty after bladder scan revealing 600 mL in the bladder. Patient was given IV fluids pending further work-up.  Work-up is unrevealing beyond positive COVID test.  Urine drug screen is negative, urinalysis negative for signs of infection or dehydration.  CBC and CMP within normal limits.  Chest x-ray unremarkable. Patient was hypertensive on arrival, blood pressure has since improved and is currently 127/81.  Heart rate stable currently 64.  Room air oxygen saturation of 100%. While discussing discharge instructions and plan of care with patient and wife, patient experienced need to void with burning sensation from chest to lower body.  Feels like his heart is pounding hard despite normal pulse reading on monitor. Plan is to give hydroxyzine for his symptoms while in the ED.  Advised to discontinue Mucinex and recheck with his doctor tomorrow if possible.  Patient's wife states he did have a similar  reaction in 2018 after taking Mucinex and questions if this is due to Mucinex. Advised to return to ED for worsening or concerning symptoms. [LM]  1515 Case discussed with Dr. Jeraldine Loots, ER attending, agrees with plan  of care. [LM]    Clinical Course User Index [LM] Alden Hipp   MDM Rules/Calculators/A&P                          Final Clinical Impression(s) / ED Diagnoses Final diagnoses:  Adverse effect of drug, initial encounter  COVID    Rx / DC Orders ED Discharge Orders     None        Alden Hipp 11/18/20 1516    Gerhard Munch, MD 11/19/20 [phone]

## 2020-11-18 NOTE — ED Triage Notes (Addendum)
Patient was seen in the ED this AM and was discharged. Patient states that he was having urinary frequency and now he can not urinate. Patient states he last urinated at 0600 prior to being discharged. Patient states that he feels very confused. Patient repeats phrases over and over. Patient also reports that he has anxiety.  Patient states when he woke from his nap at 1050 he felt like his face was numb, but not at this time.    Patient called out and informed writer of his pass code to his phone and stated, "I wrote all of my symptoms down in case I get too delirious." Patient denied any drug use . Patient has shoes off and states the circulation is gone from his feet. Feet warm and dry and pedal pulses present.

## 2020-11-18 NOTE — ED Notes (Signed)
Pt voided approx 700 mil of urine.

## 2020-11-18 NOTE — Discharge Instructions (Addendum)
STOP Mucinex. Recheck with your doctor tomorrow if possible. Return to the ER for worsening or concerning symptoms.

## 2020-11-23 ENCOUNTER — Encounter: Payer: Self-pay | Admitting: Physician Assistant

## 2020-11-23 ENCOUNTER — Ambulatory Visit (INDEPENDENT_AMBULATORY_CARE_PROVIDER_SITE_OTHER): Payer: BC Managed Care – PPO | Admitting: Physician Assistant

## 2020-11-23 ENCOUNTER — Telehealth: Payer: Self-pay

## 2020-11-23 VITALS — BP 118/78 | HR 59 | Temp 97.8°F | Ht 70.0 in | Wt 183.0 lb

## 2020-11-23 DIAGNOSIS — Z09 Encounter for follow-up examination after completed treatment for conditions other than malignant neoplasm: Secondary | ICD-10-CM | POA: Diagnosis not present

## 2020-11-23 DIAGNOSIS — J45990 Exercise induced bronchospasm: Secondary | ICD-10-CM

## 2020-11-23 DIAGNOSIS — Z8616 Personal history of COVID-19: Secondary | ICD-10-CM | POA: Diagnosis not present

## 2020-11-23 MED ORDER — PROAIR RESPICLICK 108 (90 BASE) MCG/ACT IN AEPB: INHALATION_SPRAY | RESPIRATORY_TRACT | 1 refills | Status: DC

## 2020-11-23 NOTE — Patient Instructions (Signed)
Avoid all OTC cough/cold medications that contain Guaifenesin or Mucinex

## 2020-11-23 NOTE — Telephone Encounter (Signed)
Pharmacy notified.

## 2020-11-23 NOTE — Telephone Encounter (Signed)
[facility] called and stated that [employer] will only cover generic ventolin. Please advise, thanks.

## 2020-11-23 NOTE — Progress Notes (Signed)
Established Patient Office Visit  Subjective:  Patient ID: Troy Estrada, male    DOB: 1990-05-15  Age: 30 y.o. MRN: [id number]  CC:  Chief Complaint  Patient presents with   Follow-up    HPI Troy Estrada presents for hospital discharge follow-up  Patient is accompanied by his wife today, who assists in providing history  He was seen in the ED 5 days ago on 11/18/20 and diagnosed with COVID-19 infection (symptom onset 7/09), adverse reaction to Mucinex, and possible anxiety reaction. Treated with IV fluids, GI cocktails and Hydroxyzine 25 mg Per ER note: "Describes a burning sensation from his chest down his body when he needs to void.  Patient is able to void without difficulty after bladder scan revealing 600 mL in the bladder. Patient was given IV fluids pending further work-up.  Work-up is unrevealing beyond positive COVID test.  Urine drug screen is negative, urinalysis negative for signs of infection or dehydration.  CBC and CMP within normal limits.  Chest x-ray unremarkable. Patient was hypertensive on arrival, blood pressure has since improved and is currently 127/81.  Heart rate stable currently 64.  Room air oxygen saturation of 100%."  Today he reports feeling much better. Still experiencing fatigue/exhaustion and a dry cough, that feels improved today. He has had no recurrence of paresthesias or feeling like "organs were on fire." He is wondering if COVID or the Mucinex was the cause of his symptoms last week and if he needs to see an allergist.  He would like a refill on Albuterol MDI which he uses as needed for exercise induced asthma. Denies asthma symptoms today.   Past Medical History:  Diagnosis Date   Elevated LDL cholesterol level 12/05/2015   Exercise-induced asthma 11/24/2015   Pilonidal cyst with abscess 09/06/2018    Past Surgical History:  Procedure Laterality Date   PILONIDAL CYST DRAINAGE N/A 09/05/2018   In office I&D   PILONIDAL CYST EXCISION N/A  07/31/2019   Procedure: PILONIDAL CYSTECTOMY;  Surgeon: Manus Rudd, MD;  Location: [facility];  Service: General;  Laterality: N/A;   WISDOM TOOTH EXTRACTION      Family History  Problem Relation Age of Onset   Cancer Maternal Grandmother        cervical   Diabetes Maternal Grandmother    Hypertension Maternal Grandmother    Alzheimer's disease Maternal Grandmother    Depression Mother    Hyperlipidemia Mother    Hypertension Mother    Depression Father    Diabetes Father    Hyperlipidemia Father    Depression Sister    Depression Brother    Diabetes Maternal Uncle    Cancer Maternal Grandfather        prostate   Hypertension Maternal Grandfather    Dementia Paternal Grandfather    Depression Sister    Bipolar disorder Sister     Social History   Socioeconomic History   Marital status: Unknown    Spouse name: Not on file   Number of children: Not on file   Years of education: Not on file   Highest education level: Not on file  Occupational History   Not on file  Tobacco Use   Smoking status: Never   Smokeless tobacco: Never  Vaping Use   Vaping Use: Never used  Substance and Sexual Activity   Alcohol use: Not Currently    Alcohol/week: 0.0 standard drinks    Comment: Rarely   Drug use: No   Sexual activity: Not Currently  Other  Topics Concern   Not on file  Social History Narrative   Not on file   Social Determinants of Health   Financial Resource Strain: Not on file  Food Insecurity: Not on file  Transportation Needs: Not on file  Physical Activity: Not on file  Stress: Not on file  Social Connections: Not on file  Intimate Partner Violence: Not on file    Outpatient Medications Prior to Visit  Medication Sig Dispense Refill   terbinafine (LAMISIL) 250 MG tablet Take 1 tablet (250 mg total) by mouth daily. 90 tablet 1   albuterol (VENTOLIN HFA) 108 (90 Base) MCG/ACT inhaler Inhale 2 puffs into the lungs every 6 (six) hours as needed for wheezing or  shortness of breath. 1 Inhaler 1   fluticasone (FLONASE) 50 MCG/ACT nasal spray Place 1 spray into both nostrils daily. 16 g 0   Dextromethorphan-guaiFENesin (MUCINEX DM MAXIMUM STRENGTH) 60-1200 MG TB12 Take 1 tablet by mouth every 12 (twelve) hours.     pantoprazole (PROTONIX) 20 MG tablet Take 1 tablet (20 mg total) by mouth daily. 15 tablet 0   sucralfate (CARAFATE) 1 g tablet Take 1 tablet (1 g total) by mouth 4 (four) times daily -  with meals and at bedtime. 30 tablet 0   No facility-administered medications prior to visit.    Allergies  Allergen Reactions   Mucinex [Guaifenesin Er] Other (See Comments)    Burning sensations in body, Delirious, Difficulty voiding   Doxycycline Other (See Comments)    Headaches.    ROS Review of Systems    Objective:    Physical Exam Vitals reviewed.  Constitutional:      Appearance: Normal appearance. He is not ill-appearing.  Cardiovascular:     Rate and Rhythm: Normal rate and regular rhythm.     Heart sounds: Normal heart sounds. No murmur heard. Pulmonary:     Effort: Pulmonary effort is normal.     Breath sounds: Normal breath sounds. No wheezing.  Skin:    General: Skin is warm and dry.  Neurological:     Mental Status: He is alert.  Psychiatric:        Behavior: Behavior normal.        Thought Content: Thought content normal.        Judgment: Judgment normal.    BP 118/78   Pulse (!) 59   Temp 97.8 F (36.6 C) (Oral)   Ht 5\' 10"  (1.778 m)   Wt 183 lb (83 kg)   SpO2 98%   BMI 26.26 kg/m   BP Readings from Last 3 Encounters:  11/23/20 118/78  11/18/20 127/81  11/18/20 (!) 145/94    Pulse Readings from Last 3 Encounters:  11/23/20 (!) 59  11/18/20 64  11/18/20 76     Health Maintenance Due  Topic Date Due   HIV Screening  Never done   Hepatitis C Screening  Never done    There are no preventive care reminders to display for this patient.  Recent Results (from the past 2160 hour(s))  Comprehensive  metabolic panel     Status: Abnormal   Collection Time: 11/18/20 12:46 PM  Result Value Ref Range   Sodium 136 135 - 145 mmol/L   Potassium 4.0 3.5 - 5.1 mmol/L   Chloride 100 98 - 111 mmol/L   CO2 27 22 - 32 mmol/L   Glucose, Bld 117 (H) 70 - 99 mg/dL    Comment: Glucose reference range applies only to samples taken after fasting  for at least 8 hours.   BUN 15 6 - 20 mg/dL   Creatinine, Ser 8.33 0.61 - 1.24 mg/dL   Calcium 9.3 8.9 - 82.5 mg/dL   Total Protein 8.3 (H) 6.5 - 8.1 g/dL   Albumin 4.7 3.5 - 5.0 g/dL   AST 21 15 - 41 U/L   ALT 22 0 - 44 U/L   Alkaline Phosphatase 119 38 - 126 U/L   Total Bilirubin 0.4 0.3 - 1.2 mg/dL   GFR, Estimated >05 >39 mL/min    Comment: (NOTE) Calculated using the CKD-EPI Creatinine Equation (2021)    Anion gap 9 5 - 15    Comment: Performed at [facility], [address], [place], [place] [address]  CBC with Differential     Status: None   Collection Time: 11/18/20 12:46 PM  Result Value Ref Range   WBC 5.8 4.0 - 10.5 K/uL   RBC 4.95 4.22 - 5.81 MIL/uL   Hemoglobin 14.3 13.0 - 17.0 g/dL   HCT 19.3 79.0 - 24.0 %   MCV 86.5 80.0 - 100.0 fL   MCH 28.9 26.0 - 34.0 pg   MCHC 33.4 30.0 - 36.0 g/dL   RDW 97.3 53.2 - 99.2 %   Platelets 285 150 - 400 K/uL   nRBC 0.0 0.0 - 0.2 %   Neutrophils Relative % 81 %   Neutro Abs 4.7 1.7 - 7.7 K/uL   Lymphocytes Relative 12 %   Lymphs Abs 0.7 0.7 - 4.0 K/uL   Monocytes Relative 7 %   Monocytes Absolute 0.4 0.1 - 1.0 K/uL   Eosinophils Relative 0 %   Eosinophils Absolute 0.0 0.0 - 0.5 K/uL   Basophils Relative 0 %   Basophils Absolute 0.0 0.0 - 0.1 K/uL   Immature Granulocytes 0 %   Abs Immature Granulocytes 0.01 0.00 - 0.07 K/uL    Comment: Performed at [facility], [address], [place], [place] [address]  Resp Panel by RT-PCR (Flu A&B, Covid) Nasopharyngeal Swab     Status: Abnormal   Collection Time: 11/18/20 12:54 PM   Specimen: Nasopharyngeal Swab;  Nasopharyngeal(NP) swabs in vial transport medium  Result Value Ref Range   SARS Coronavirus 2 by RT PCR POSITIVE (A) NEGATIVE    Comment: RESULT CALLED TO, READ BACK BY AND VERIFIED WITH: M BOWEN AT 1430 ON 7//14/2022 BY MOSLEY,J  (NOTE) SARS-CoV-2 target nucleic acids are DETECTED.  The SARS-CoV-2 RNA is generally detectable in upper respiratory specimens during the acute phase of infection. Positive results are indicative of the presence of the identified virus, but do not rule out bacterial infection or co-infection with other pathogens not detected by the test. Clinical correlation with patient history and other diagnostic information is necessary to determine patient infection status. The expected result is Negative.  Fact Sheet for Patients: BloggerCourse.com  Fact Sheet for Healthcare Providers: SeriousBroker.it  This test is not yet approved or cleared by the [place] FDA and  has been authorized for detection and/or diagnosis of SARS-CoV-2 by FDA under an Emergency Use Authorization (EUA).  This EUA will remain in effect (meaning this te st can be used) for the duration of  the COVID-19 declaration under Section 564(b)(1) of the Act, 21 U.S.C. section 360bbb-3(b)(1), unless the authorization is terminated or revoked sooner.     Influenza A by PCR NEGATIVE NEGATIVE   Influenza B by PCR NEGATIVE NEGATIVE    Comment: (NOTE) The Xpert Xpress SARS-CoV-2/FLU/RSV plus assay is  intended as an aid in the diagnosis of influenza from Nasopharyngeal swab specimens and should not be used as a sole basis for treatment. Nasal washings and aspirates are unacceptable for Xpert Xpress SARS-CoV-2/FLU/RSV testing.  Fact Sheet for Patients: BloggerCourse.com  Fact Sheet for Healthcare Providers: SeriousBroker.it  This test is not yet approved or cleared by the [place] FDA  and has been authorized for detection and/or diagnosis of SARS-CoV-2 by FDA under an Emergency Use Authorization (EUA). This EUA will remain in effect (meaning this test can be used) for the duration of the COVID-19 declaration under Section 564(b)(1) of the Act, 21 U.S.C. section 360bbb-3(b)(1), unless the authorization is terminated or revoked.  Performed at [facility], [address], [place], [place] [address]   Urinalysis, Routine w reflex microscopic Urine, Clean Catch     Status: Abnormal   Collection Time: 11/18/20  1:28 PM  Result Value Ref Range   Color, Urine STRAW (A) YELLOW   APPearance CLEAR CLEAR   Specific Gravity, Urine 1.009 1.005 - 1.030   pH 7.0 5.0 - 8.0   Glucose, UA NEGATIVE NEGATIVE mg/dL   Hgb urine dipstick NEGATIVE NEGATIVE   Bilirubin Urine NEGATIVE NEGATIVE   Ketones, ur NEGATIVE NEGATIVE mg/dL   Protein, ur NEGATIVE NEGATIVE mg/dL   Nitrite NEGATIVE NEGATIVE   Leukocytes,Ua NEGATIVE NEGATIVE    Comment: Performed at [facility], [address] [place], [place] [address]  Urine rapid drug screen (hosp performed)     Status: None   Collection Time: 11/18/20  1:37 PM  Result Value Ref Range   Opiates NONE DETECTED NONE DETECTED   Cocaine NONE DETECTED NONE DETECTED   Benzodiazepines NONE DETECTED NONE DETECTED   Amphetamines NONE DETECTED NONE DETECTED   Tetrahydrocannabinol NONE DETECTED NONE DETECTED   Barbiturates NONE DETECTED NONE DETECTED    Comment: (NOTE) DRUG SCREEN FOR MEDICAL PURPOSES ONLY.  IF CONFIRMATION IS NEEDED FOR ANY PURPOSE, NOTIFY LAB WITHIN 5 DAYS.  LOWEST DETECTABLE LIMITS FOR URINE DRUG SCREEN Drug Class                     Cutoff (ng/mL) Amphetamine and metabolites    1000 Barbiturate and metabolites    200 Benzodiazepine                 200 Tricyclics and metabolites     300 Opiates and metabolites        300 Cocaine and metabolites        300 THC                             50 Performed at [facility], [address], [place], [place] [address]      No results found for: HGBA1C    Assessment & Plan:   Problem List Items Addressed This Visit       Respiratory   Exercise-induced asthma   Relevant Medications   Albuterol Sulfate (PROAIR RESPICLICK) 108 (90 Base) MCG/ACT AEPB     Other   History of 2019 novel coronavirus disease (COVID-19)   Other Visit Diagnoses     Hospital discharge follow-up    -  Primary      Personally reviewed labs dated 11/18/20 and ER assessment and plan note Discussed that cannot determine for certain whether symptoms were due to drug reaction, COVID-19 infection or both Would recommend avoiding all Guaifenesin/Mucinex products going  forward Discussed that referral to allergist would not be beneficial - no hx of anaphylaxis, food or environmental allergies Refilled Albuterol MDI prn exercise induced asthma  Meds ordered this encounter  Medications   Albuterol Sulfate (PROAIR RESPICLICK) 108 (90 Base) MCG/ACT AEPB    Sig: Inhale 1-2 puffs 30 mins prior to aerobic exercise; repeat 1-2 puff Q4H prn SOB/wheezing/bronchospasm    Dispense:  1 each    Refill:  1    Order Specific Question:   Supervising Provider    Answer:   Sunnie Nielsen [[id number]]     Follow-up: No follow-ups on file.    Carlis Stable, [place]

## 2020-11-23 NOTE — Telephone Encounter (Signed)
Please give verbal order to switch to generic Ventolin Sig is the same Substitution permitted

## 2020-12-29 ENCOUNTER — Ambulatory Visit: Payer: BC Managed Care – PPO | Admitting: Physician Assistant

## 2020-12-29 ENCOUNTER — Other Ambulatory Visit: Payer: Self-pay

## 2020-12-29 ENCOUNTER — Ambulatory Visit (INDEPENDENT_AMBULATORY_CARE_PROVIDER_SITE_OTHER): Payer: BC Managed Care – PPO

## 2020-12-29 ENCOUNTER — Encounter: Payer: Self-pay | Admitting: Physician Assistant

## 2020-12-29 VITALS — BP 129/80 | HR 80 | Ht 70.0 in | Wt 188.1 lb

## 2020-12-29 DIAGNOSIS — M79644 Pain in right finger(s): Secondary | ICD-10-CM

## 2020-12-29 DIAGNOSIS — S6991XA Unspecified injury of right wrist, hand and finger(s), initial encounter: Secondary | ICD-10-CM | POA: Diagnosis not present

## 2020-12-29 MED ORDER — IBUPROFEN 600 MG PO TABS: 600.0000 mg | ORAL_TABLET | Freq: Three times a day (TID) | ORAL | 0 refills | Status: DC | PRN

## 2020-12-29 NOTE — Patient Instructions (Signed)
Skier's Thumb  Skier's thumb is a stretched or torn ligament in the thumb from a sudden injury (acute injury). It is sometimes called gamekeeper's thumb if it developed gradually (chronic injury) from repeated overstretching of the ligaments. Ligaments are strong bands of tissue that connect bones. The ligament that is injured (ulnar collateral ligament) connects the bones that make up the joint at the base of the thumb. A tear can be either partial or complete. The severity of the injury depends on how much of the ligament was damaged or torn. If it is not treated properly, thisinjury can lead to arthritis. What are the causes? This condition occurs when the thumb is forcefully moved past its normal range of motion toward the wrist. It may be caused by: Falling onto an outstretched hand. This often happens to skiers who fall with ski poles in their hands. Repeated movements that use the thumb, like catching a ball or other object. What increases the risk? You are more likely to develop this condition if: You had a previous thumb injury. You play contact sports or sports that involve catching balls, such as baseball, basketball, or football. You do activities where the thumb will be pulled away from the rest of the hand. You have poor hand strength and flexibility. You do not warm up properly before activities. What are the signs or symptoms? Symptoms of this condition include: Pain or tenderness. Swelling. Trouble grasping or pinching with the injured thumb. Bruising or redness. If the injury is severe, a lump (mass) may be felt under the skin in the injured area. How is this diagnosed? This condition may be diagnosed based on: Your symptoms and medical history. A physical exam. Imaging tests, such as X-rays, ultrasound, or MRI. How is this treated? Treatment for this condition depends on the severity of your injury. If the ligament is overstretched or partially torn, treatment usually  involves keeping your thumb in a fixed position (immobilization) for a period of time. Your health care provider will apply a brace, splint, or cast to keep your thumb from moving until it heals. If the ligament is fully torn, you may need surgery to reconnect the ligament to the bone. After surgery, you will need to wear a cast or splint on your thumb. Your health care provider may also suggest exercises or physical therapy tostrengthen your thumb. Follow these instructions at home: Medicines Take over-the-counter and prescription medicines only as told by your health care provider. Ask your health care provider if the medicine prescribed to you: Requires you to avoid driving or using machinery. Can cause constipation. You may need to take these actions to prevent or treat constipation: Drink enough fluid to keep your urine pale yellow. Take over-the-counter or prescription medicine. Eat foods that are high in fiber, such as beans, whole grains, and fresh fruits and vegetables. Limit foods that are high in fat and processed sugars, such as fried or sweet foods. If you have a nonremovable cast: Do not put pressure on any part of the cast until it is fully hardened. This may take several hours. Do not stick anything inside the cast to scratch your skin. Doing that increases your risk of infection. Check the skin around the cast every day. Tell your health care provider about any concerns. You may put lotion on dry skin around the edges of the cast. Do not put lotion on the skin underneath the cast. Keep the cast clean and dry. If you have a removable splint or  brace: Wear the splint or brace as told by your health care provider. Remove it only as told by your health care provider. Check the skin around the splint or brace every day. Tell your health care provider about any concerns. Loosen the splint or brace if your fingers tingle, become numb, or turn cold and blue. Keep the splint or brace  clean and dry. Bathing Do not take baths, swim, or use a hot tub until your health care provider approves. Ask your health care provider if you may take showers. You may only be allowed to take sponge baths. If your cast, splint, or brace is not waterproof: Do not let it get wet. Cover it with a watertight covering when you take a bath or shower. Managing pain, stiffness, and swelling  If directed, put ice on the injured area. To do this: If you have a removable splint or brace, remove it as told by your health care provider. Put ice in a plastic bag. Place a towel between your skin and the bag or between your cast and the bag. Leave the ice on for 20 minutes, 2-3 times a day. Remove the ice if your skin turns bright red. This is very important. If you cannot feel pain, heat, or cold, you have a greater risk of damage to the area. Move your fingers often to reduce stiffness and swelling. Raise (elevate) the injured area above the level of your heart while you are sitting or lying down.  Activity Return to your normal activities as told by your health care provider. Ask your health care provider what activities are safe for you. Do exercises as told by your health care provider or physical therapist. General instructions Ask your health care provider when it is safe to drive if you have a cast, splint, or brace on your hand. Do not wear rings on your injured thumb. Keep all follow-up visits. This is important. Contact a health care provider if: Your pain is not controlled with medicine. Your bruising or swelling gets worse. Your cast or splint is damaged. Your thumb is numb and feels colder to the touch than normal. Get help right away if: You have severe pain. Your thumb is pale or blue. Summary Skier's thumb is a stretched or torn ligament in the thumb. This injury can happen suddenly (acute) or may develop gradually (chronic). Treatment usually involves wearing a cast, splint, or  brace on your thumb. Surgery may be needed if the ligament is fully torn. This information is not intended to replace advice given to you by your health care provider. Make sure you discuss any questions you have with your healthcare provider. Document Revised: 03/17/2020 Document Reviewed: 03/17/2020 Elsevier Patient Education  2022 [employer].

## 2020-12-29 NOTE — Progress Notes (Signed)
No acute fracture. Treatment plan stays the same.

## 2020-12-29 NOTE — Progress Notes (Signed)
   Subjective:    Patient ID: Troy Estrada, male    DOB: [birth year], 30 y.o.   MRN: [id number]  HPI Pt is a 30 yo male who presents to the clinic with right thumb pain for last 5 days since playing basketball with his nephew. Painful to move thumb and worried about it effecting him from using the camera at work.    .. Active Ambulatory Problems    Diagnosis Date Noted   Exercise-induced asthma 11/24/2015   Elevated LDL cholesterol level 12/05/2015   Pilonidal cyst with abscess 09/06/2018   Hernia, inguinal, right 05/14/2019   Pilonidal cyst 05/14/2019   Sensation of lump in throat 08/19/2020   Tickle in throat 08/19/2020   Onychomycosis 10/12/2020   Seasonal allergies 10/12/2020   Dermatitis 10/12/2020   History of 2019 novel coronavirus disease (COVID-19) 11/23/2020   Resolved Ambulatory Problems    Diagnosis Date Noted   No Resolved Ambulatory Problems   No Additional Past Medical History     Review of Systems    See HPI.  Objective:   Physical Exam  No bruising or significant swelling.  Pain over the ulnar collateral ligament to palpation Pain with flexion and extension both passive and restrictive.  Negative Finkelstein.  NROM of wrist.       Assessment & Plan:  Marland KitchenMarland KitchenKensley was seen today for thumb pain.  Diagnoses and all orders for this visit:  Pain of right thumb -     DG Finger Thumb Right; Future -     ibuprofen (ADVIL) 600 MG tablet; Take 1 tablet (600 mg total) by mouth every 8 (eight) hours as needed.  Suspect strain and inflammation of ulnar collateral ligament. Xray ordered today. Ibuprofen for 5 days. Wear thumb spica for 2 week. Ice as needed. Start with hand exercises with squeeze ball after 2 weeks mobilization. Follow up with pain worsening or loss of function.

## 2021-03-21 ENCOUNTER — Ambulatory Visit: Payer: BC Managed Care – PPO | Admitting: Physician Assistant

## 2021-03-21 ENCOUNTER — Telehealth: Payer: Self-pay | Admitting: Physician Assistant

## 2021-03-21 NOTE — Telephone Encounter (Signed)
Patient called to cancel @ 8:02am, said he didn't need appt, didn't know rather to leave patient on schedule or cancel patient off. AM

## 2021-03-23 DIAGNOSIS — H93293 Other abnormal auditory perceptions, bilateral: Secondary | ICD-10-CM | POA: Diagnosis not present

## 2021-04-05 DIAGNOSIS — J102 Influenza due to other identified influenza virus with gastrointestinal manifestations: Secondary | ICD-10-CM | POA: Diagnosis not present

## 2021-07-04 DIAGNOSIS — J019 Acute sinusitis, unspecified: Secondary | ICD-10-CM | POA: Diagnosis not present

## 2021-08-04 DIAGNOSIS — A084 Viral intestinal infection, unspecified: Secondary | ICD-10-CM | POA: Diagnosis not present

## 2021-08-04 DIAGNOSIS — R112 Nausea with vomiting, unspecified: Secondary | ICD-10-CM | POA: Diagnosis not present

## 2021-08-04 DIAGNOSIS — R197 Diarrhea, unspecified: Secondary | ICD-10-CM | POA: Diagnosis not present

## 2021-09-26 DIAGNOSIS — N644 Mastodynia: Secondary | ICD-10-CM | POA: Diagnosis not present

## 2022-02-22 ENCOUNTER — Telehealth: Payer: Self-pay

## 2022-02-22 ENCOUNTER — Ambulatory Visit (INDEPENDENT_AMBULATORY_CARE_PROVIDER_SITE_OTHER): Payer: BC Managed Care – PPO | Admitting: Physician Assistant

## 2022-02-22 VITALS — BP 115/76 | HR 75 | Ht 70.0 in | Wt 181.0 lb

## 2022-02-22 DIAGNOSIS — F410 Panic disorder [episodic paroxysmal anxiety] without agoraphobia: Secondary | ICD-10-CM | POA: Insufficient documentation

## 2022-02-22 DIAGNOSIS — Z131 Encounter for screening for diabetes mellitus: Secondary | ICD-10-CM

## 2022-02-22 DIAGNOSIS — Z1329 Encounter for screening for other suspected endocrine disorder: Secondary | ICD-10-CM

## 2022-02-22 DIAGNOSIS — J45990 Exercise induced bronchospasm: Secondary | ICD-10-CM

## 2022-02-22 DIAGNOSIS — Z1159 Encounter for screening for other viral diseases: Secondary | ICD-10-CM | POA: Diagnosis not present

## 2022-02-22 DIAGNOSIS — E7800 Pure hypercholesterolemia, unspecified: Secondary | ICD-10-CM | POA: Diagnosis not present

## 2022-02-22 DIAGNOSIS — Z23 Encounter for immunization: Secondary | ICD-10-CM

## 2022-02-22 DIAGNOSIS — Z0000 Encounter for general adult medical examination without abnormal findings: Secondary | ICD-10-CM

## 2022-02-22 MED ORDER — PROAIR RESPICLICK 108 (90 BASE) MCG/ACT IN AEPB: INHALATION_SPRAY | RESPIRATORY_TRACT | 1 refills | Status: DC

## 2022-02-22 NOTE — Progress Notes (Signed)
Complete physical exam  Patient: Troy Estrada   DOB: [birth year]   31 y.o. Male  MRN: [id number]  Subjective:    Chief Complaint  Patient presents with   Annual Exam    Jader Desai is a 31 y.o. male who presents today for a complete physical exam. He reports consuming a general diet. The patient does not participate in regular exercise at present. He generally feels well. He reports sleeping well. He does have additional problems to discuss today.   He has noticed increase in frequency of panic attacks. Able to breath through them and with time resolve.    Most recent fall risk assessment:    02/22/2022    9:39 AM  Fall Risk   Falls in the past year? 0  Number falls in past yr: 0  Injury with Fall? 0  Risk for fall due to : No Fall Risks  Follow up Falls evaluation completed     Most recent depression screenings:    02/22/2022    9:39 AM 07/31/2017    8:08 AM  PHQ 2/9 Scores  PHQ - 2 Score 0 0  PHQ- 9 Score  0    Vision:Not within last year , Dental: No current dental problems, and STD: The patient denies history of sexually transmitted disease.  Patient Active Problem List   Diagnosis Date Noted   Panic attacks 02/22/2022   History of 2019 novel coronavirus disease (COVID-19) 11/23/2020   Onychomycosis 10/12/2020   Seasonal allergies 10/12/2020   Dermatitis 10/12/2020   Sensation of lump in throat 08/19/2020   Tickle in throat 08/19/2020   Hernia, inguinal, right 05/14/2019   Pilonidal cyst 05/14/2019   Pilonidal cyst with abscess 09/06/2018   Elevated LDL cholesterol level 12/05/2015   Exercise-induced asthma 11/24/2015   Past Medical History:  Diagnosis Date   Elevated LDL cholesterol level 12/05/2015   Exercise-induced asthma 11/24/2015   Pilonidal cyst with abscess 09/06/2018   Family History  Problem Relation Age of Onset   Cancer Maternal Grandmother        cervical   Diabetes Maternal Grandmother    Hypertension Maternal Grandmother     Alzheimer's disease Maternal Grandmother    Depression Mother    Hyperlipidemia Mother    Hypertension Mother    Depression Father    Diabetes Father    Hyperlipidemia Father    Depression Sister    Depression Brother    Diabetes Maternal Uncle    Cancer Maternal Grandfather        prostate   Hypertension Maternal Grandfather    Dementia Paternal Grandfather    Depression Sister    Bipolar disorder Sister    Allergies  Allergen Reactions   Mucinex [Guaifenesin Er] Other (See Comments)    Burning sensations in body, Delirious, Difficulty voiding   Doxycycline Other (See Comments)    Headaches.      Patient Care Team: Nolene Ebbs as PCP - General (Family Medicine) Janalyn Harder, MD (Inactive) as Consulting Physician (Dermatology)   Outpatient Medications Prior to Visit  Medication Sig   [DISCONTINUED] Albuterol Sulfate (PROAIR RESPICLICK) 108 (90 Base) MCG/ACT AEPB Inhale 1-2 puffs 30 mins prior to aerobic exercise; repeat 1-2 puff Q4H prn SOB/wheezing/bronchospasm   [DISCONTINUED] ibuprofen (ADVIL) 600 MG tablet Take 1 tablet (600 mg total) by mouth every 8 (eight) hours as needed.   No facility-administered medications prior to visit.    Review of Systems  All other systems reviewed and are negative.  Objective:     BP 115/76   Pulse 75   Ht 5\' 10"  (1.778 m)   Wt 181 lb (82.1 kg)   SpO2 97%   BMI 25.97 kg/m  BP Readings from Last 3 Encounters:  02/22/22 115/76  12/29/20 129/80  11/23/20 118/78   Wt Readings from Last 3 Encounters:  02/22/22 181 lb (82.1 kg)  12/29/20 188 lb 1.3 oz (85.3 kg)  11/23/20 183 lb (83 kg)    ..    02/22/2022    9:39 AM 07/31/2017    8:08 AM  Depression screen PHQ 2/9  Decreased Interest 0 0  Down, Depressed, Hopeless 0 0  PHQ - 2 Score 0 0  Altered sleeping  0  Tired, decreased energy  0  Change in appetite  0  Feeling bad or failure about yourself   0  Trouble concentrating  0  Moving slowly  or fidgety/restless  0  Suicidal thoughts  0  PHQ-9 Score  0  Difficult doing work/chores  Not difficult at all      Physical Exam     BP 115/76   Pulse 75   Ht 5\' 10"  (1.778 m)   Wt 181 lb (82.1 kg)   SpO2 97%   BMI 25.97 kg/m   General Appearance:    Alert, cooperative, no distress, appears stated age  Head:    Normocephalic, without obvious abnormality, atraumatic  Eyes:    PERRL, conjunctiva/corneas clear, EOM's intact, fundi    benign, both eyes       Ears:    Normal TM's and external ear canals, both ears  Nose:   Nares normal, septum midline, mucosa normal, no drainage    or sinus tenderness  Throat:   Lips, mucosa, and tongue normal; teeth and gums normal  Neck:   Supple, symmetrical, trachea midline, no adenopathy;       thyroid:  No enlargement/tenderness/nodules; no carotid   bruit or JVD  Back:     Symmetric, no curvature, ROM normal, no CVA tenderness  Lungs:     Clear to auscultation bilaterally, respirations unlabored  Chest wall:    No tenderness or deformity  Heart:    Regular rate and rhythm, S1 and S2 normal, no murmur, rub   or gallop  Abdomen:     Soft, non-tender, bowel sounds active all four quadrants,    no masses, no organomegaly        Extremities:   Extremities normal, atraumatic, no cyanosis or edema  Pulses:   2+ and symmetric all extremities  Skin:   Skin color, texture, turgor normal, no rashes or lesions  Lymph nodes:   Cervical, supraclavicular, and axillary nodes normal  Neurologic:   CNII-XII intact. Normal strength, sensation and reflexes      throughout   Assessment & Plan:    Routine Health Maintenance and Physical Exam  Immunization History  Administered Date(s) Administered   Hepatitis A, Adult 06/05/2017, 12/12/2017   Hepatitis B, adult 06/15/2017, 07/24/2017, 12/12/2017   Influenza,inj,Quad PF,6+ Mos 02/17/2019, 02/22/2022   Influenza-Unspecified 02/19/2017   PFIZER(Purple Top)SARS-COV-2 Vaccination 07/10/2019, 07/29/2019,  01/31/2020   Pfizer Covid-19 Vaccine Bivalent Booster 3yrs & up 01/22/2021   Tdap 06/05/2017    Health Maintenance  Topic Date Due   Hepatitis C Screening  Never done   HIV Screening  02/23/2023 (Originally 01/06/2006)   TETANUS/TDAP  06/06/2027   INFLUENZA VACCINE  Completed   COVID-19 Vaccine  Completed   HPV VACCINES  Aged Out  Discussed health benefits of physical activity, and encouraged him to engage in regular exercise appropriate for his age and condition.  Marland KitchenMitzi Hansen was seen today for annual exam.  Diagnoses and all orders for this visit:  Annual physical exam -     TSH -     Lipid Panel w/reflex Direct LDL -     COMPLETE METABOLIC PANEL WITH GFR -     CBC w/Diff/Platelet  Elevated LDL cholesterol level -     Lipid Panel w/reflex Direct LDL  Thyroid disorder screen -     TSH  Diabetes mellitus screening -     COMPLETE METABOLIC PANEL WITH GFR  Encounter for hepatitis C screening test for low risk patient -     Hepatitis C Antibody -     CBC w/Diff/Platelet  Exercise-induced asthma -     Albuterol Sulfate (PROAIR RESPICLICK) 062 (90 Base) MCG/ACT AEPB; Inhale 1-2 puffs 30 mins prior to aerobic exercise; repeat 1-2 puff Q4H prn SOB/wheezing/bronchospasm  Flu vaccine need -     Flu Vaccine QUAD 34mo+IM (Fluarix, Fluzone & Alfiuria Quad PF)  Panic attacks   .Marland KitchenStart a regular exercise program and make sure you are eating a healthy diet Try to eat 4 servings of dairy a day or take a calcium supplement (500mg  twice a day). Your vaccines are up to date. Flu shot given today.  Albuterol refilled for prn usage Fasting labs ordered Discussed panic/anxiety Consider ashwaganda and or calm aid Consider counseling  Consider regular exercise HO given  Follow up as needed      Iran Planas, PA-C

## 2022-02-22 NOTE — Patient Instructions (Addendum)
Ashwaganda or calm aid Managing Anxiety, Adult After being diagnosed with anxiety, you may be relieved to know why you have felt or behaved a certain way. You may also feel overwhelmed about the treatment ahead and what it will mean for your life. With care and support, you can manage this condition. How to manage lifestyle changes Managing stress and anxiety  Stress is your body's reaction to life changes and events, both good and bad. Most stress will last just a few hours, but stress can be ongoing and can lead to more than just stress. Although stress can play a major role in anxiety, it is not the same as anxiety. Stress is usually caused by something external, such as a deadline, test, or competition. Stress normally passes after the triggering event has ended.  Anxiety is caused by something internal, such as imagining a terrible outcome or worrying that something will go wrong that will devastate you. Anxiety often does not go away even after the triggering event is over, and it can become long-term (chronic) worry. It is important to understand the differences between stress and anxiety and to manage your stress effectively so that it does not lead to an anxious response. Talk with your health care provider or a counselor to learn more about reducing anxiety and stress. He or she may suggest tension reduction techniques, such as: Music therapy. Spend time creating or listening to music that you enjoy and that inspires you. Mindfulness-based meditation. Practice being aware of your normal breaths while not trying to control your breathing. It can be done while sitting or walking. Centering prayer. This involves focusing on a word, phrase, or sacred image that means something to you and brings you peace. Deep breathing. To do this, expand your stomach and inhale slowly through your nose. Hold your breath for 3-5 seconds. Then exhale slowly, letting your stomach muscles relax. Self-talk. Learn to  notice and identify thought patterns that lead to anxiety reactions and change those patterns to thoughts that feel peaceful. Muscle relaxation. Taking time to tense muscles and then relax them. Choose a tension reduction technique that fits your lifestyle and personality. These techniques take time and practice. Set aside 5-15 minutes a day to do them. Therapists can offer counseling and training in these techniques. The training to help with anxiety may be covered by some insurance plans. Other things you can do to manage stress and anxiety include: Keeping a stress diary. This can help you learn what triggers your reaction and then learn ways to manage your response. Thinking about how you react to certain situations. You may not be able to control everything, but you can control your response. Making time for activities that help you relax and not feeling guilty about spending your time in this way. Doing visual imagery. This involves imagining or creating mental pictures to help you relax. Practicing yoga. Through yoga poses, you can lower tension and promote relaxation.  Medicines Medicines can help ease symptoms. Medicines for anxiety include: Antidepressant medicines. These are usually prescribed for long-term daily control. Anti-anxiety medicines. These may be added in severe cases, especially when panic attacks occur. Medicines will be prescribed by a health care provider. When used together, medicines, psychotherapy, and tension reduction techniques may be the most effective treatment. Relationships Relationships can play a big part in helping you recover. Try to spend more time connecting with trusted friends and family members. Consider going to couples counseling if you have a partner, taking family education  classes, or going to family therapy. Therapy can help you and others better understand your condition. How to recognize changes in your anxiety Everyone responds differently to  treatment for anxiety. Recovery from anxiety happens when symptoms decrease and stop interfering with your daily activities at home or work. This may mean that you will start to: Have better concentration and focus. Worry will interfere less in your daily thinking. Sleep better. Be less irritable. Have more energy. Have improved memory. It is also important to recognize when your condition is getting worse. Contact your health care provider if your symptoms interfere with home or work and you feel like your condition is not improving. Follow these instructions at home: Activity Exercise. Adults should do the following: Exercise for at least 150 minutes each week. The exercise should increase your heart rate and make you sweat (moderate-intensity exercise). Strengthening exercises at least twice a week. Get the right amount and quality of sleep. Most adults need 7-9 hours of sleep each night. Lifestyle  Eat a healthy diet that includes plenty of vegetables, fruits, whole grains, low-fat dairy products, and lean protein. Do not eat a lot of foods that are high in fats, added sugars, or salt (sodium). Make choices that simplify your life. Do not use any products that contain nicotine or tobacco. These products include cigarettes, chewing tobacco, and vaping devices, such as e-cigarettes. If you need help quitting, ask your health care provider. Avoid caffeine, alcohol, and certain over-the-counter cold medicines. These may make you feel worse. Ask your pharmacist which medicines to avoid. General instructions Take over-the-counter and prescription medicines only as told by your health care provider. Keep all follow-up visits. This is important. Where to find support You can get help and support from these sources: Self-help groups. Online and [employer]. A trusted spiritual leader. Couples counseling. Family education classes. Family therapy. Where to find more  information You may find that joining a support group helps you deal with your anxiety. The following sources can help you locate counselors or support groups near you: Mental Health America: www.mentalhealthamerica.net Anxiety and Depression Association of [place] (ADAA): ProgramCam.de [employer] on Mental Illness (NAMI): www.nami.org Contact a health care provider if: You have a hard time staying focused or finishing daily tasks. You spend many hours a day feeling worried about everyday life. You become exhausted by worry. You start to have headaches or frequently feel tense. You develop chronic nausea or diarrhea. Get help right away if: You have a racing heart and shortness of breath. You have thoughts of hurting yourself or others. If you ever feel like you may hurt yourself or others, or have thoughts about taking your own life, get help right away. Go to your nearest emergency department or: Call your local emergency services (911 in the [place] Call a suicide crisis helpline, such as the National Suicide Prevention Lifeline at (867)066-8752 or 988 in the [place] This is open 24 hours a day in the [place] Text the Crisis Text Line at 7185951238 (in the [place] Summary Taking steps to learn and use tension reduction techniques can help calm you and help prevent triggering an anxiety reaction. When used together, medicines, psychotherapy, and tension reduction techniques may be the most effective treatment. Family, friends, and partners can play a big part in supporting you. This information is not intended to replace advice given to you by your health care provider. Make sure you discuss any questions you have with your health care provider. Document Revised: 11/17/2020  Document Reviewed: 08/15/2020 Elsevier Patient Education  [employer].   Panic Attack A panic attack is a sudden episode of severe anxiety, fear, or discomfort that causes physical and emotional symptoms. A panic  attack may be in response to something frightening, or it may occur for no known reason. Symptoms of a panic attack can be similar to symptoms of a heart attack or stroke. It is important to see your health care provider when you have a panic attack so that these conditions can be ruled out. What are the causes? A panic attack may be caused by: An extreme, life-threatening situation, such as a war or natural disaster. An anxiety disorder, such as post-traumatic stress disorder. Depression. Panic disorder. Certain medical conditions, including heart problems, neurological conditions, and infections. Other causes may include: Certain over-the-counter and prescription medicines. Supplements that increase anxiety. Illegal drugs that increase heart rate and blood pressure, such as methamphetamine. What increases the risk? You are more likely to develop this condition if: You have another mental health condition. You use alcohol, illegal drugs, or other substances. You are under extreme stress. A life event is causing increased feelings of anxiety and depression. What are the signs or symptoms? A panic attack starts suddenly, usually lasts 5-10 minutes, and occurs with one or more of the following: A pounding heart, or a feeling that your heart is beating irregularly or faster than normal (palpitations). Sweating, trembling, or shaking. Shortness of breath, feeling smothered, or feeling choked. Chest pain or discomfort. Nausea or a strange feeling in your stomach. Dizziness, feeling light-headed, or feeling like you might faint. Other symptoms may include: Chills or hot flashes. Numbness or tingling in your lips, hands, or feet. Feeling confused, or feeling that you are not yourself. Fear of losing control or of being emotionally unstable, or fear of dying. How is this diagnosed? A panic attack is diagnosed with an assessment by your health care provider. During the assessment, your health  care provider will ask questions about: Your history of anxiety, depression, and panic attacks. Your medical history. Whether you drink alcohol, use drugs, take supplements, or take medicines. Be honest about your substance use. Your health care provider may also: Order blood tests or other kinds of tests to rule out serious medical conditions. Refer you to a mental health professional for further evaluation. How is this treated? A panic attack is a symptom of another condition. Treatment depends on the cause of the panic attack. If the cause is a medical problem, your health care provider will treat that problem or refer you to a specialist. If the cause is emotional, you may be given anti-anxiety medicines or referred to a counselor. Anti-anxiety medicines may reduce how often attacks happen, reduce how severe the attacks are, and lower anxiety. If the cause is a medicine, your health care provider may tell you to stop the medicine, change your dose, or take a different medicine. If the cause is an illegal drug, treatment may involve letting the drug wear off and taking medicine to help the drug leave your body or to stop its effects. Attacks caused by heavy drug use may continue even if you stop using the drug. Most panic attacks go away with treatment of the underlying problem. If you have panic attacks often, you may have a condition called panic disorder. Follow these instructions at home: Alcohol use Do not drink alcohol if: Your health care provider tells you not to drink. You are pregnant, may be pregnant,  or are planning to become pregnant. If you drink alcohol: Limit how much you have to: 0-1 drink a day for women. 0-2 drinks a day for men. Know how much alcohol is in your drink. In the [place] one drink equals one 12 oz bottle of beer (355 mL), one 5 oz glass of wine (148 mL), or one 1 oz glass of hard liquor (44 mL). General instructions Take over-the-counter and prescription  medicines only as told by your health care provider. If you feel anxious, limit your caffeine intake. Take good care of your physical and mental health by: Eating a balanced diet that includes plenty of fresh fruits and vegetables, whole grains, lean meats, and low-fat dairy. Getting plenty of rest. Try to get 7-8 hours of uninterrupted sleep each night. Exercising regularly. Try to get 30 minutes of physical activity at least 5 days a week. Do not use any products that contain nicotine or tobacco. These products include cigarettes, chewing tobacco, and vaping devices, such as e-cigarettes. If you need help quitting, ask your health care provider. Keep all follow-up visits. This is important. Panic attacks may have underlying physical or emotional problems that take time to accurately diagnose. Where to find more information Substance Abuse and Mental Health Services Administration [facility]): [employer] of Mental Health [facility]): http://www.maynard.net/ Contact a health care provider if: Your symptoms do not improve, or they get worse. You are not able to take your medicine as prescribed because of side effects. Get help right away if: You have thoughts about hurting yourself or others. Get help right away if you feel like you may hurt yourself or others, or have thoughts about taking your own life. Go to your nearest emergency room or: Call 911. Call the National Suicide Prevention Lifeline at 908-301-6099 or 988. This is open 24 hours a day. Text the Crisis Text Line at (318)196-7160. Summary A panic attack is a sudden episode of severe anxiety, fear, or discomfort that causes physical and emotional symptoms. Always see a health care provider to have the reasons for the panic attack correctly diagnosed. If your panic attack was caused by a physical problem, follow your health care provider's suggestions for medicine, referral to a specialist, and lifestyle changes. If your panic attack  was caused by an emotional problem, follow through with counseling from a qualified mental health specialist. If you feel like you may hurt yourself or others, call 911 and get help right away. This information is not intended to replace advice given to you by your health care provider. Make sure you discuss any questions you have with your health care provider. Document Revised: 12/02/2020 Document Reviewed: 12/02/2020 Elsevier Patient Education  2023 Elsevier Inc.   Health Maintenance, Male Adopting a healthy lifestyle and getting preventive care are important in promoting health and wellness. Ask your health care provider about: The right schedule for you to have regular tests and exams. Things you can do on your own to prevent diseases and keep yourself healthy. What should I know about diet, weight, and exercise? Eat a healthy diet  Eat a diet that includes plenty of vegetables, fruits, low-fat dairy products, and lean protein. Do not eat a lot of foods that are high in solid fats, added sugars, or sodium. Maintain a healthy weight Body mass index (BMI) is a measurement that can be used to identify possible weight problems. It estimates body fat based on height and weight. Your health care provider can help determine your BMI and  help you achieve or maintain a healthy weight. Get regular exercise Get regular exercise. This is one of the most important things you can do for your health. Most adults should: Exercise for at least 150 minutes each week. The exercise should increase your heart rate and make you sweat (moderate-intensity exercise). Do strengthening exercises at least twice a week. This is in addition to the moderate-intensity exercise. Spend less time sitting. Even light physical activity can be beneficial. Watch cholesterol and blood lipids Have your blood tested for lipids and cholesterol at 31 years of age, then have this test every 5 years. You may need to have your  cholesterol levels checked more often if: Your lipid or cholesterol levels are high. You are older than 31 years of age. You are at high risk for heart disease. What should I know about cancer screening? Many types of cancers can be detected early and may often be prevented. Depending on your health history and family history, you may need to have cancer screening at various ages. This may include screening for: Colorectal cancer. Prostate cancer. Skin cancer. Lung cancer. What should I know about heart disease, diabetes, and high blood pressure? Blood pressure and heart disease High blood pressure causes heart disease and increases the risk of stroke. This is more likely to develop in people who have high blood pressure readings or are overweight. Talk with your health care provider about your target blood pressure readings. Have your blood pressure checked: Every 3-5 years if you are 71-45 years of age. Every year if you are 11 years old or older. If you are between the ages of 70 and 60 and are a current or former smoker, ask your health care provider if you should have a one-time screening for abdominal aortic aneurysm (AAA). Diabetes Have regular diabetes screenings. This checks your fasting blood sugar level. Have the screening done: Once every three years after age 62 if you are at a normal weight and have a low risk for diabetes. More often and at a younger age if you are overweight or have a high risk for diabetes. What should I know about preventing infection? Hepatitis B If you have a higher risk for hepatitis B, you should be screened for this virus. Talk with your health care provider to find out if you are at risk for hepatitis B infection. Hepatitis C Blood testing is recommended for: Everyone born from [birth year] through 1965. Anyone with known risk factors for hepatitis C. Sexually transmitted infections (STIs) You should be screened each year for STIs, including gonorrhea  and chlamydia, if: You are sexually active and are younger than 31 years of age. You are older than 31 years of age and your health care provider tells you that you are at risk for this type of infection. Your sexual activity has changed since you were last screened, and you are at increased risk for chlamydia or gonorrhea. Ask your health care provider if you are at risk. Ask your health care provider about whether you are at high risk for HIV. Your health care provider may recommend a prescription medicine to help prevent HIV infection. If you choose to take medicine to prevent HIV, you should first get tested for HIV. You should then be tested every 3 months for as long as you are taking the medicine. Follow these instructions at home: Alcohol use Do not drink alcohol if your health care provider tells you not to drink. If you drink alcohol: Limit how  much you have to 0-2 drinks a day. Know how much alcohol is in your drink. In the [place] one drink equals one 12 oz bottle of beer (355 mL), one 5 oz glass of wine (148 mL), or one 1 oz glass of hard liquor (44 mL). Lifestyle Do not use any products that contain nicotine or tobacco. These products include cigarettes, chewing tobacco, and vaping devices, such as e-cigarettes. If you need help quitting, ask your health care provider. Do not use street drugs. Do not share needles. Ask your health care provider for help if you need support or information about quitting drugs. General instructions Schedule regular health, dental, and eye exams. Stay current with your vaccines. Tell your health care provider if: You often feel depressed. You have ever been abused or do not feel safe at home. Summary Adopting a healthy lifestyle and getting preventive care are important in promoting health and wellness. Follow your health care provider's instructions about healthy diet, exercising, and getting tested or screened for diseases. Follow your health  care provider's instructions on monitoring your cholesterol and blood pressure. This information is not intended to replace advice given to you by your health care provider. Make sure you discuss any questions you have with your health care provider. Document Revised: 09/13/2020 Document Reviewed: 09/13/2020 Elsevier Patient Education  2023 [employer]

## 2022-02-22 NOTE — Telephone Encounter (Signed)
Pt requested a change in pharmacy.

## 2022-02-23 LAB — TSH: TSH: 1.56 mIU/L (ref 0.40–4.50)

## 2022-02-23 LAB — COMPLETE METABOLIC PANEL WITH GFR
AG Ratio: 1.8 (calc) (ref 1.0–2.5)
ALT: 14 U/L (ref 9–46)
AST: 12 U/L (ref 10–40)
Albumin: 5 g/dL (ref 3.6–5.1)
Alkaline phosphatase (APISO): 106 U/L (ref 36–130)
BUN: 17 mg/dL (ref 7–25)
CO2: 28 mmol/L (ref 20–32)
Calcium: 10.1 mg/dL (ref 8.6–10.3)
Chloride: 103 mmol/L (ref 98–110)
Creat: 0.86 mg/dL (ref 0.60–1.26)
Globulin: 2.8 g/dL (calc) (ref 1.9–3.7)
Glucose, Bld: 85 mg/dL (ref 65–139)
Potassium: 4.4 mmol/L (ref 3.5–5.3)
Sodium: 139 mmol/L (ref 135–146)
Total Bilirubin: 0.5 mg/dL (ref 0.2–1.2)
Total Protein: 7.8 g/dL (ref 6.1–8.1)
eGFR: 119 mL/min/{1.73_m2} (ref >=60)

## 2022-02-23 LAB — LIPID PANEL W/REFLEX DIRECT LDL
Cholesterol: 252 mg/dL — ABNORMAL HIGH (ref <200)
HDL: 53 mg/dL (ref >=40)
LDL Cholesterol (Calc): 177 mg/dL (calc) — ABNORMAL HIGH
Non-HDL Cholesterol (Calc): 199 mg/dL (calc) — ABNORMAL HIGH (ref <130)
Total CHOL/HDL Ratio: 4.8 (calc) (ref <5.0)
Triglycerides: 103 mg/dL (ref <150)

## 2022-02-23 LAB — HEPATITIS C ANTIBODY: Hepatitis C Ab: NONREACTIVE

## 2022-02-24 ENCOUNTER — Encounter: Payer: Self-pay | Admitting: Physician Assistant

## 2022-02-24 NOTE — Progress Notes (Signed)
Troy Estrada,   Kidney, liver, glucose look great.  Thyroid is normal.  Your cholesterol continues to elevate.  Your HDL good cholesterol is good.  Your LDL, bad cholesterol, is pretty high.  You have no other cardiovascular risk factors but this is likely genetic is going to take medication not just diet and exercise changes. I would like to start a statin, cholesterol lowering drug, thoughts?

## 2022-05-24 DIAGNOSIS — Z0000 Encounter for general adult medical examination without abnormal findings: Secondary | ICD-10-CM | POA: Diagnosis not present

## 2022-05-24 LAB — CBC WITH DIFFERENTIAL/PLATELET
Absolute Monocytes: 268 cells/uL (ref 200–950)
Basophils Absolute: 48 cells/uL (ref 0–200)
Basophils Relative: 1.1 %
Eosinophils Absolute: 101 cells/uL (ref 15–500)
Eosinophils Relative: 2.3 %
HCT: 42.4 % (ref 38.5–50.0)
Hemoglobin: 14.4 g/dL (ref 13.2–17.1)
Lymphs Abs: 1566 cells/uL (ref 850–3900)
MCH: 29.9 pg (ref 27.0–33.0)
MCHC: 34 g/dL (ref 32.0–36.0)
MCV: 88 fL (ref 80.0–100.0)
MPV: 10.5 fL (ref 7.5–12.5)
Monocytes Relative: 6.1 %
Neutro Abs: 2416 cells/uL (ref 1500–7800)
Neutrophils Relative %: 54.9 %
Platelets: 306 10*3/uL (ref 140–400)
RBC: 4.82 10*6/uL (ref 4.20–5.80)
RDW: 12.4 % (ref 11.0–15.0)
Total Lymphocyte: 35.6 %
WBC: 4.4 10*3/uL (ref 3.8–10.8)

## 2022-05-26 ENCOUNTER — Encounter: Payer: Self-pay | Admitting: Physician Assistant

## 2022-05-26 DIAGNOSIS — E7800 Pure hypercholesterolemia, unspecified: Secondary | ICD-10-CM

## 2022-05-29 ENCOUNTER — Other Ambulatory Visit: Payer: Self-pay

## 2022-05-29 ENCOUNTER — Telehealth: Payer: Self-pay

## 2022-05-29 DIAGNOSIS — E7800 Pure hypercholesterolemia, unspecified: Secondary | ICD-10-CM

## 2022-05-29 NOTE — Telephone Encounter (Signed)
What diagnosis code for the CMP should I use?

## 2022-05-29 NOTE — Telephone Encounter (Signed)
Cholesterol was not done. He will have to come back and have cholesterol and go ahead and get cmp ordered as well.

## 2022-05-30 ENCOUNTER — Other Ambulatory Visit: Payer: Self-pay

## 2022-05-30 DIAGNOSIS — Z79899 Other long term (current) drug therapy: Secondary | ICD-10-CM

## 2022-05-30 NOTE — Telephone Encounter (Signed)
error 

## 2022-05-30 NOTE — Telephone Encounter (Signed)
Cmp order added

## 2022-06-09 ENCOUNTER — Other Ambulatory Visit: Payer: BC Managed Care – PPO

## 2022-06-09 DIAGNOSIS — E7800 Pure hypercholesterolemia, unspecified: Secondary | ICD-10-CM

## 2022-06-09 DIAGNOSIS — Z79899 Other long term (current) drug therapy: Secondary | ICD-10-CM

## 2022-06-13 ENCOUNTER — Other Ambulatory Visit: Payer: BC Managed Care – PPO

## 2022-06-13 DIAGNOSIS — E7800 Pure hypercholesterolemia, unspecified: Secondary | ICD-10-CM

## 2022-06-13 DIAGNOSIS — Z79899 Other long term (current) drug therapy: Secondary | ICD-10-CM

## 2022-06-14 ENCOUNTER — Encounter: Payer: Self-pay | Admitting: Physician Assistant

## 2022-06-14 LAB — COMPREHENSIVE METABOLIC PANEL
ALT: 15 IU/L (ref 0–44)
AST: 17 IU/L (ref 0–40)
Albumin/Globulin Ratio: 2 (ref 1.2–2.2)
Albumin: 5 g/dL (ref 4.1–5.1)
Alkaline Phosphatase: 118 IU/L (ref 44–121)
BUN/Creatinine Ratio: 19 (ref 9–20)
BUN: 18 mg/dL (ref 6–20)
Bilirubin Total: 0.5 mg/dL (ref 0.0–1.2)
CO2: 26 mmol/L (ref 20–29)
Calcium: 9.9 mg/dL (ref 8.7–10.2)
Chloride: 101 mmol/L (ref 96–106)
Creatinine, Ser: 0.94 mg/dL (ref 0.76–1.27)
Globulin, Total: 2.5 g/dL (ref 1.5–4.5)
Glucose: 97 mg/dL (ref 70–99)
Potassium: 4.4 mmol/L (ref 3.5–5.2)
Sodium: 141 mmol/L (ref 134–144)
Total Protein: 7.5 g/dL (ref 6.0–8.5)
eGFR: 111 mL/min/{1.73_m2} (ref >59)

## 2022-06-14 LAB — LIPID PANEL
Chol/HDL Ratio: 5 ratio (ref 0.0–5.0)
Cholesterol, Total: 238 mg/dL — ABNORMAL HIGH (ref 100–199)
HDL: 48 mg/dL (ref >39)
LDL Chol Calc (NIH): 175 mg/dL — ABNORMAL HIGH (ref 0–99)
Triglycerides: 84 mg/dL (ref 0–149)
VLDL Cholesterol Cal: 15 mg/dL (ref 5–40)

## 2022-06-14 NOTE — Progress Notes (Signed)
Let me know if you want to consider any cholesterol lowering medications.

## 2022-06-14 NOTE — Progress Notes (Signed)
LDL has stayed the same.  HDL has worsen a bit.  Kidney, liver, glucose looks good.

## 2022-06-19 ENCOUNTER — Telehealth (INDEPENDENT_AMBULATORY_CARE_PROVIDER_SITE_OTHER): Payer: BC Managed Care – PPO | Admitting: Physician Assistant

## 2022-06-19 ENCOUNTER — Encounter: Payer: Self-pay | Admitting: Physician Assistant

## 2022-06-19 DIAGNOSIS — E7800 Pure hypercholesterolemia, unspecified: Secondary | ICD-10-CM | POA: Diagnosis not present

## 2022-06-19 DIAGNOSIS — Z79899 Other long term (current) drug therapy: Secondary | ICD-10-CM

## 2022-06-19 MED ORDER — ATORVASTATIN CALCIUM 10 MG PO TABS: 10.0000 mg | ORAL_TABLET | Freq: Every day | ORAL | 3 refills | Status: DC

## 2022-06-19 NOTE — Progress Notes (Signed)
..  Virtual Visit via Video Note  I connected with Troy Estrada on 06/19/22 at 11:30 AM EST by a video enabled telemedicine application and verified that I am speaking with the correct person using two identifiers.  Location: Patient: work Provider: clinic  .Marland KitchenParticipating in visit:  Patient: Troy Estrada Provider: Iran Planas PA-C   I discussed the limitations of evaluation and management by telemedicine and the availability of in person appointments. The patient expressed understanding and agreed to proceed.  History of Present Illness: Pt has had persistent LDL elevation of 175 and would like to discuss medication since diet changes and exercise have not worked.     Observations/Objective: No acute distress Normal mood and appearance   Assessment and Plan: Marland KitchenMarland KitchenItalo was seen today for hyperlipidemia.  Diagnoses and all orders for this visit:  Medication management  Elevated LDL cholesterol level -     atorvastatin (LIPITOR) 10 MG tablet; Take 1 tablet (10 mg total) by mouth at bedtime.   Discussed risk and goal of LDL under 130.  Discussed side effects and benefits of statins Agreed to start lipitor Recheck LDL in 4-6 months   Follow Up Instructions:    I discussed the assessment and treatment plan with the patient. The patient was provided an opportunity to ask questions and all were answered. The patient agreed with the plan and demonstrated an understanding of the instructions.   The patient was advised to call back or seek an in-person evaluation if the symptoms worsen or if the condition fails to improve as anticipated.   Iran Planas, PA-C

## 2022-08-12 DIAGNOSIS — J00 Acute nasopharyngitis [common cold]: Secondary | ICD-10-CM | POA: Diagnosis not present

## 2022-08-12 DIAGNOSIS — J3089 Other allergic rhinitis: Secondary | ICD-10-CM | POA: Diagnosis not present

## 2022-08-13 DIAGNOSIS — J0140 Acute pansinusitis, unspecified: Secondary | ICD-10-CM | POA: Diagnosis not present

## 2022-08-20 IMAGING — CR DG CHEST 2V
3 series · 3 of 3 positions shown · non-contrast
Comparison: No priors.

CLINICAL DATA: 29-year-old male with possible aspiration of a pill.

EXAM:
CHEST - 2 VIEW

[w chest pa (1 of 2)]
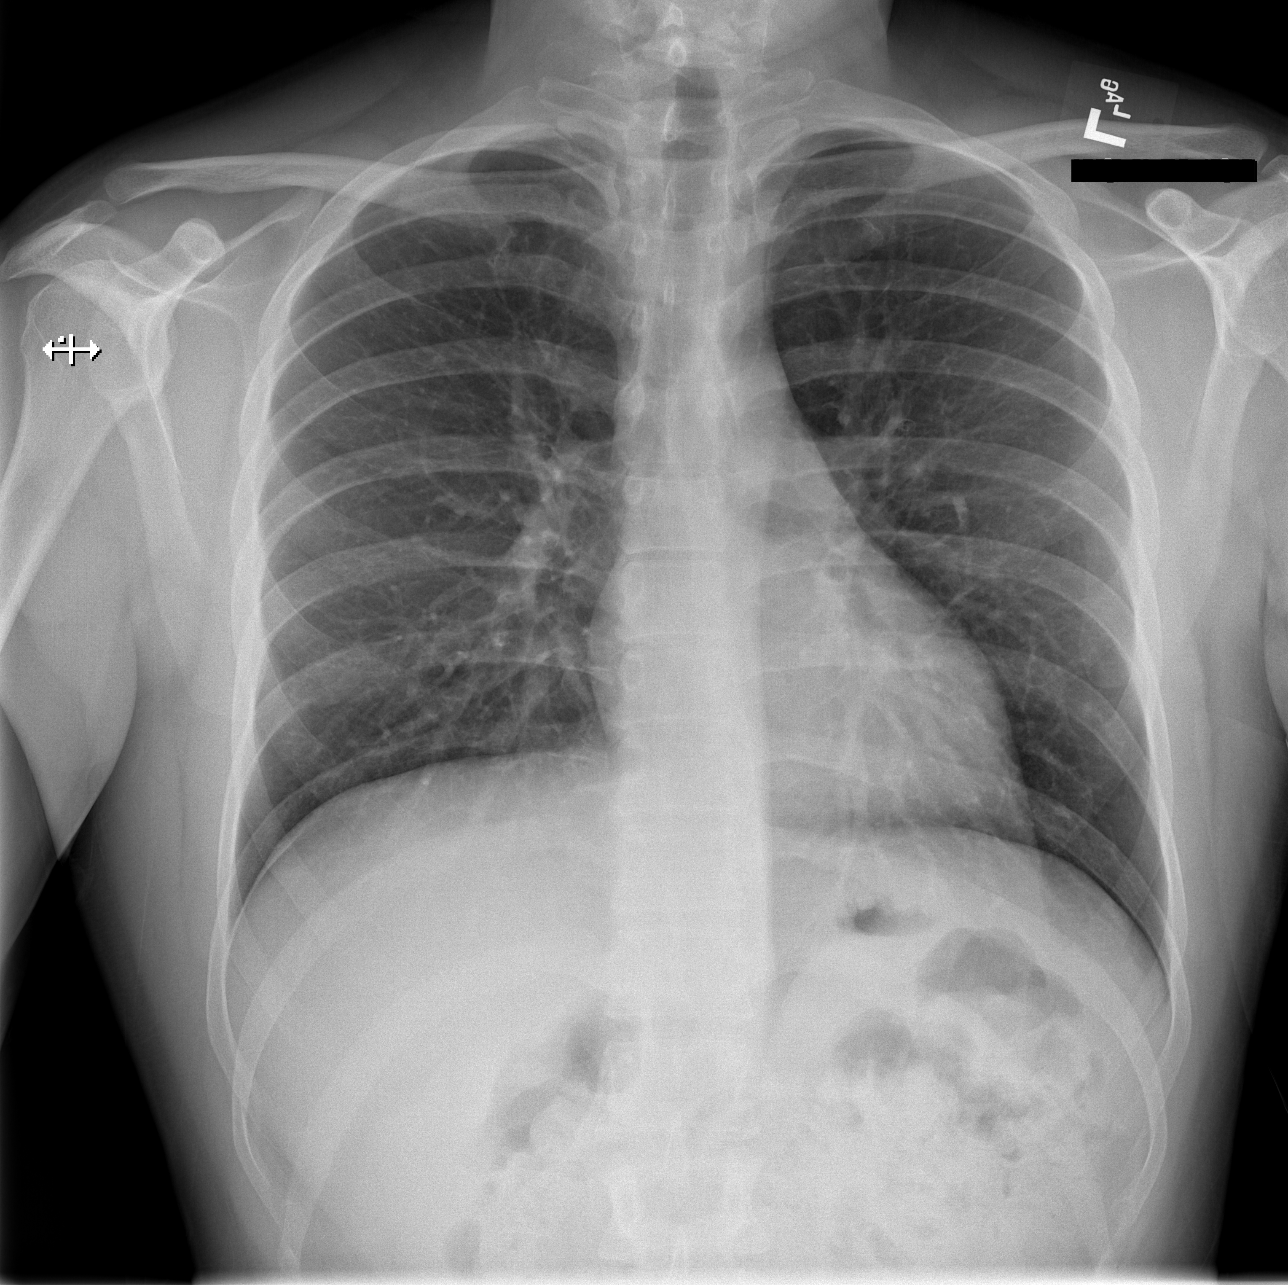

[w chest pa (2 of 2)]
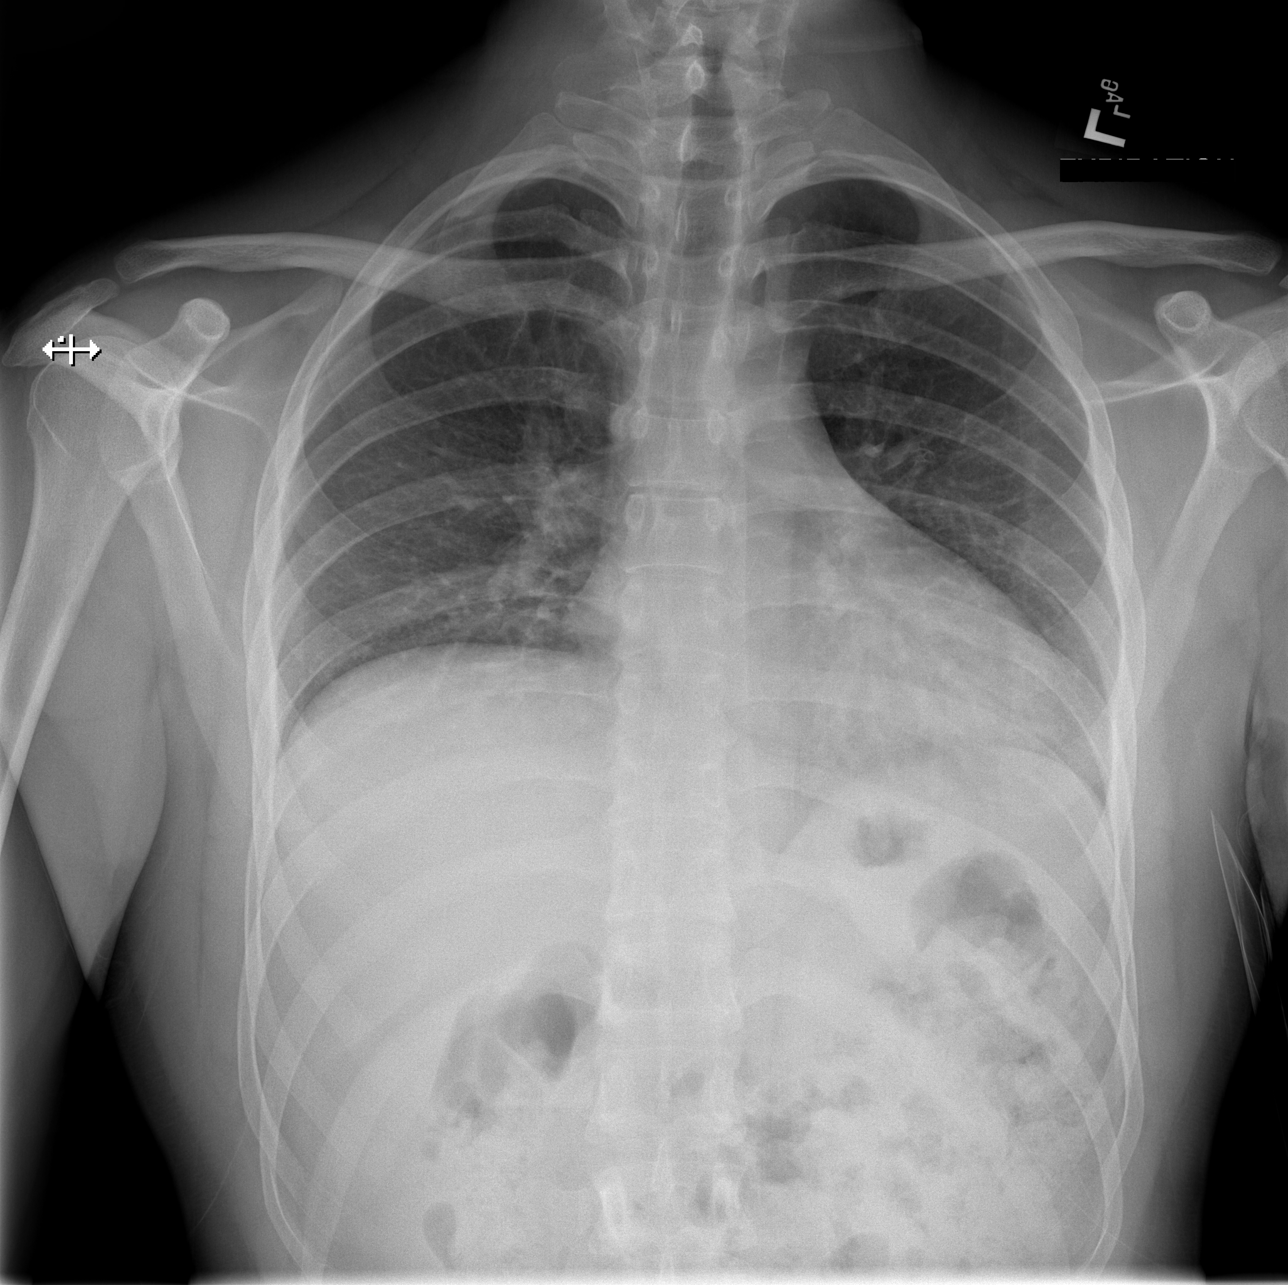

[w chest lat]
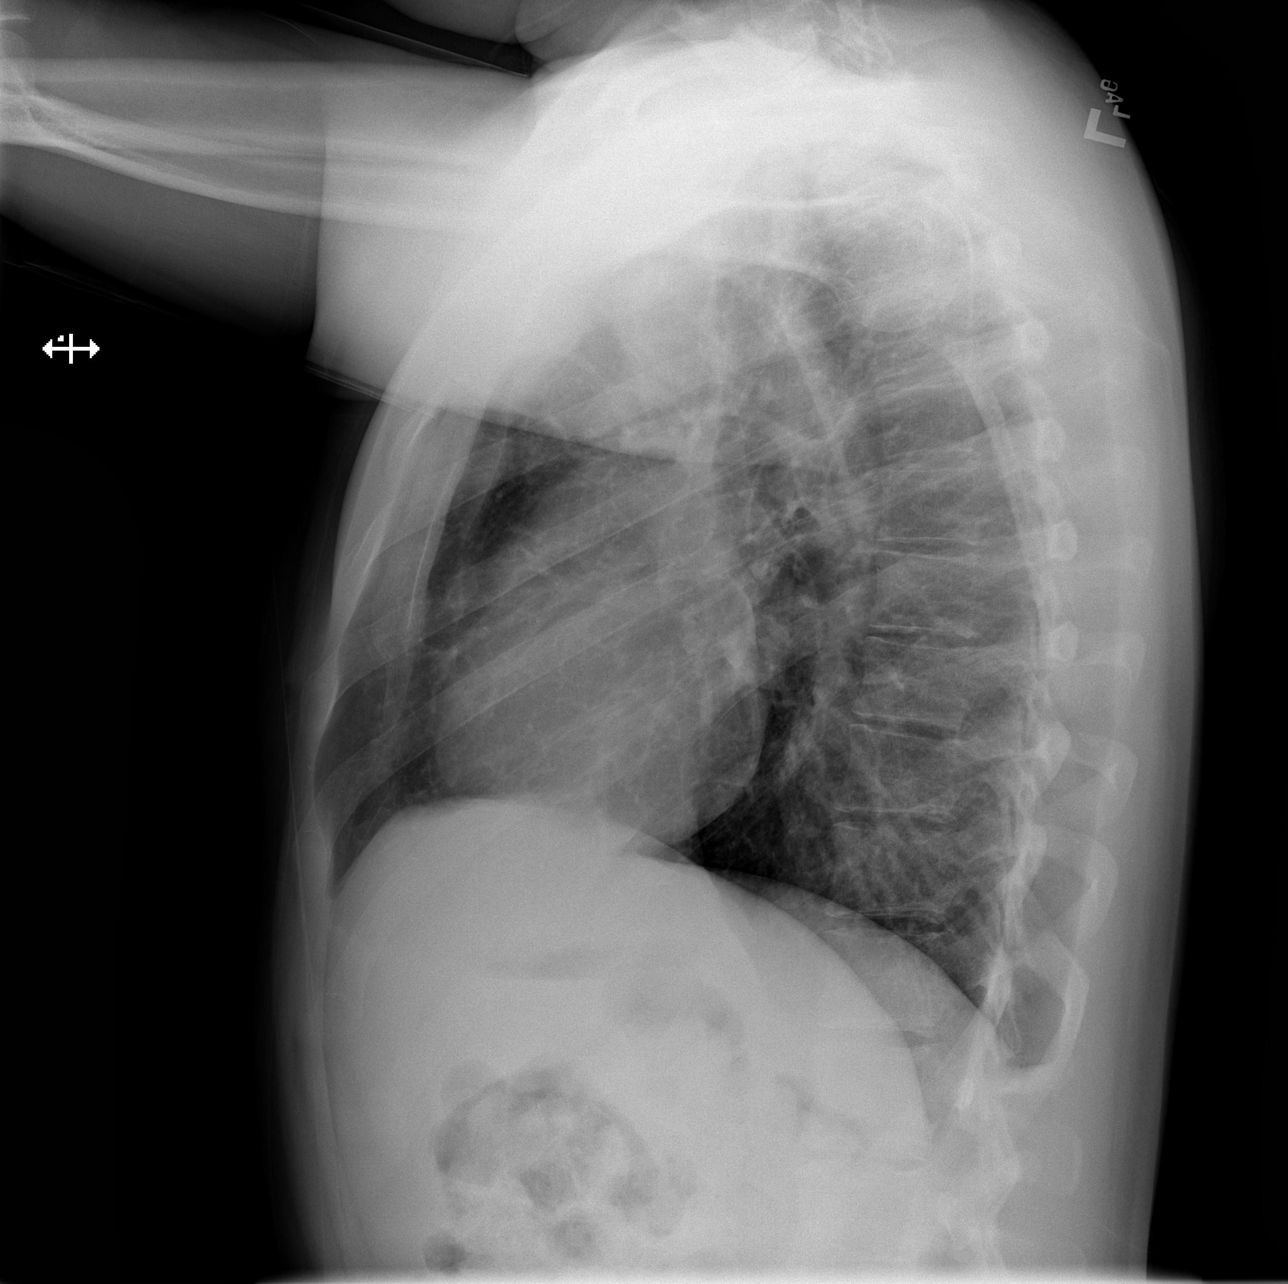

[3 of 3 positions shown; findings below may reference images not displayed]

FINDINGS: Lung volumes are normal. No consolidative airspace disease. No
pleural effusions. No pneumothorax. No pulmonary nodule or mass
noted. Pulmonary vasculature and the cardiomediastinal silhouette
are within normal limits.
IMPRESSION: No radiographic evidence of acute cardiopulmonary disease.
Specifically, no radiopaque foreign body identified.

## 2022-08-20 IMAGING — DX DG CHEST 1V PORT
1 series · 1 of 1 positions shown · non-contrast
Comparison: Previous exam from October 19, 2020.

CLINICAL DATA: Fever, COVID positive 29-year-old male.

EXAM:
PORTABLE CHEST 1 VIEW

[chest ap]
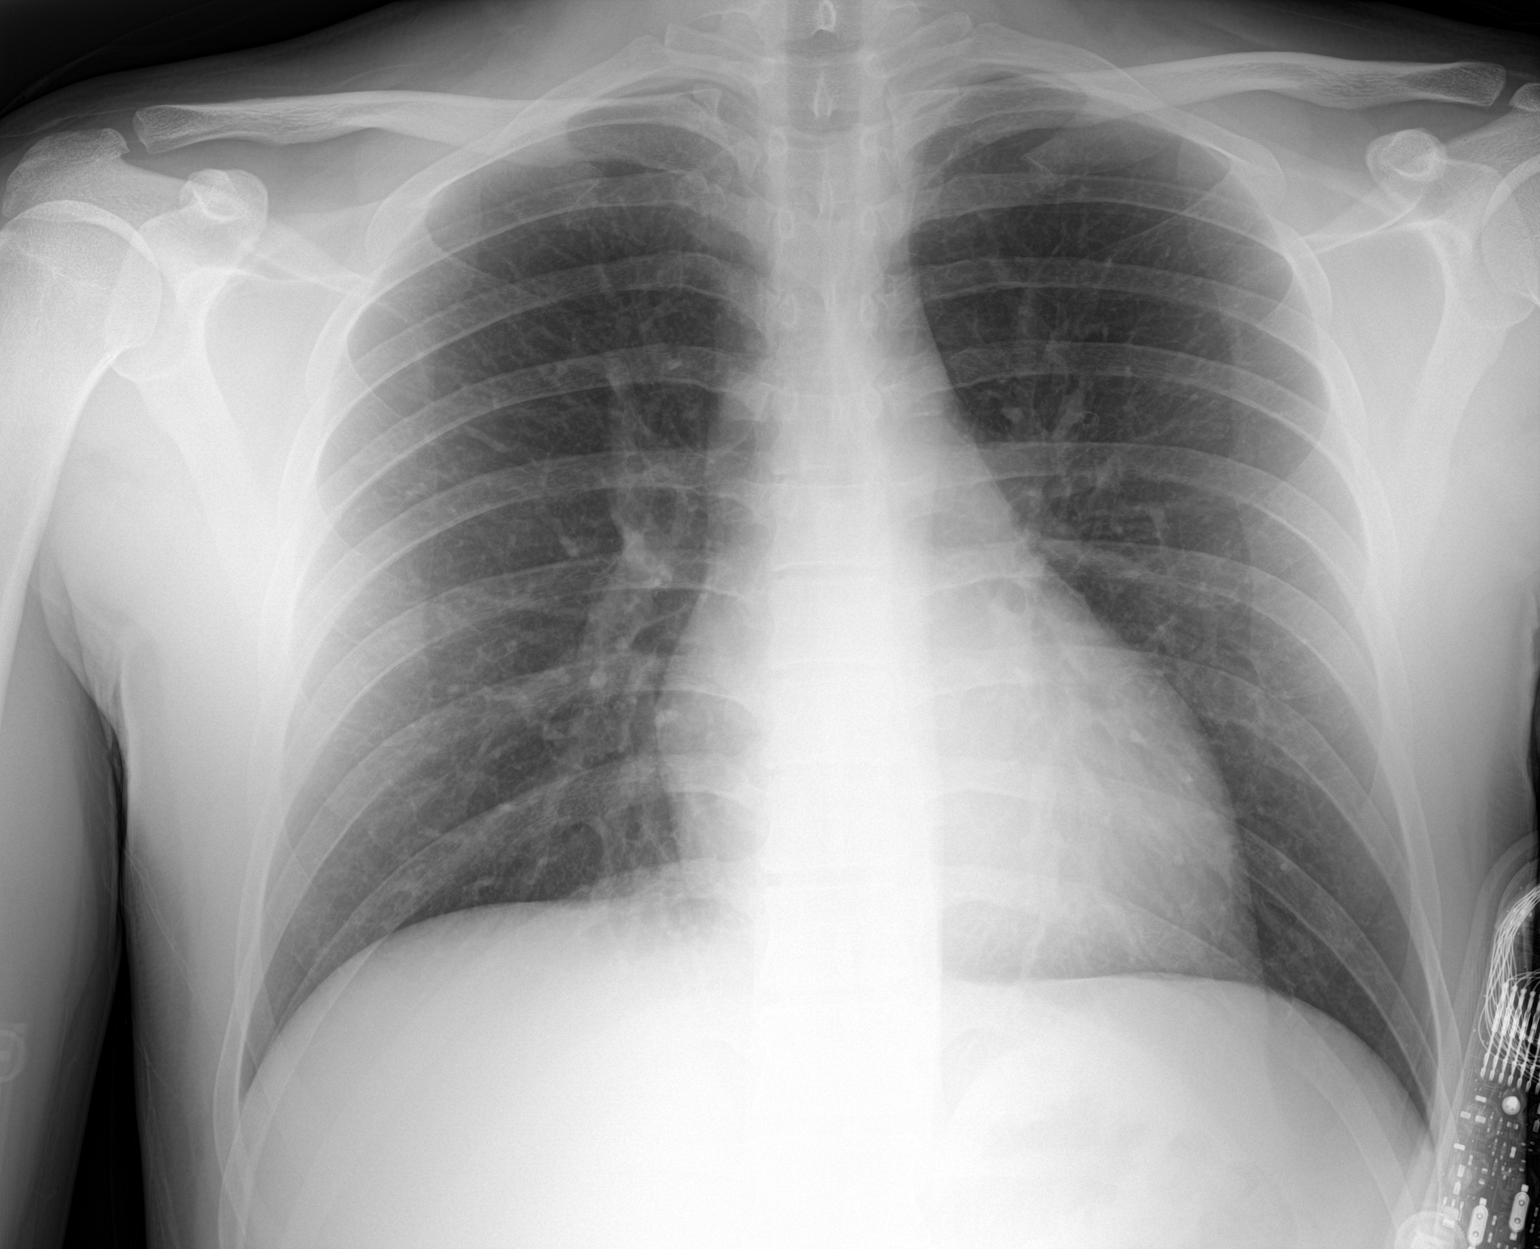

[1 of 1 positions shown; findings below may reference images not displayed]

FINDINGS: Trachea is midline. Cardiomediastinal contours and hilar structures
are normal.

Lungs are clear.  No pneumothorax.

On limited assessment no acute skeletal process.
IMPRESSION: No acute cardiopulmonary disease.  Is

## 2022-10-20 ENCOUNTER — Ambulatory Visit: Payer: BC Managed Care – PPO | Admitting: Physician Assistant

## 2022-12-15 ENCOUNTER — Encounter: Payer: Self-pay | Admitting: Physician Assistant

## 2023-02-13 DIAGNOSIS — Z23 Encounter for immunization: Secondary | ICD-10-CM | POA: Diagnosis not present

## 2023-02-15 ENCOUNTER — Encounter: Payer: Self-pay | Admitting: Physician Assistant

## 2023-02-19 ENCOUNTER — Other Ambulatory Visit: Payer: Self-pay

## 2023-02-19 DIAGNOSIS — E7800 Pure hypercholesterolemia, unspecified: Secondary | ICD-10-CM

## 2023-02-27 ENCOUNTER — Other Ambulatory Visit: Payer: Self-pay | Admitting: *Deleted

## 2023-02-27 ENCOUNTER — Other Ambulatory Visit: Payer: Self-pay

## 2023-02-27 DIAGNOSIS — Z1211 Encounter for screening for malignant neoplasm of colon: Secondary | ICD-10-CM

## 2023-02-27 DIAGNOSIS — Z125 Encounter for screening for malignant neoplasm of prostate: Secondary | ICD-10-CM

## 2023-02-27 DIAGNOSIS — E7800 Pure hypercholesterolemia, unspecified: Secondary | ICD-10-CM

## 2023-02-27 NOTE — Progress Notes (Signed)
Patient: Troy Estrada           Date of Birth: Jul 25, 1990           MRN: [id number] Visit Date: 02/27/2023 PCP: Jomarie Longs, PA-C  Prostate Cancer Screening Date of last physical exam:  (2023) Date of last rectal exam:  (never) Have you ever had any of the following?: Family member with prostate cancer Have you ever had or been told you have an allergy to latex products?: No Are you currently taking any natural prostate preparations?: No Are you currently experiencing any urinary symptoms?: No  Prostate Exam Exam not completed. PSA blood test only completed.  Patient's History Patient Active Problem List   Diagnosis Date Noted   Panic attacks 02/22/2022   History of 2019 novel coronavirus disease (COVID-19) 11/23/2020   Onychomycosis 10/12/2020   Seasonal allergies 10/12/2020   Dermatitis 10/12/2020   Sensation of lump in throat 08/19/2020   Tickle in throat 08/19/2020   Hernia, inguinal, right 05/14/2019   Pilonidal cyst 05/14/2019   Pilonidal cyst with abscess 09/06/2018   Elevated LDL cholesterol level 12/05/2015   Exercise-induced asthma 11/24/2015   Past Medical History:  Diagnosis Date   Elevated LDL cholesterol level 12/05/2015   Exercise-induced asthma 11/24/2015   Pilonidal cyst with abscess 09/06/2018    Family History  Problem Relation Age of Onset   Cancer Maternal Grandmother        cervical   Diabetes Maternal Grandmother    Hypertension Maternal Grandmother    Alzheimer's disease Maternal Grandmother    Depression Mother    Hyperlipidemia Mother    Hypertension Mother    Depression Father    Diabetes Father    Hyperlipidemia Father    Depression Sister    Depression Brother    Diabetes Maternal Uncle    Cancer Maternal Grandfather        prostate   Hypertension Maternal Grandfather    Dementia Paternal Grandfather    Depression Sister    Bipolar disorder Sister     Social History   Occupational History   Not on file  Tobacco Use    Smoking status: Never   Smokeless tobacco: Never  Vaping Use   Vaping status: Never Used  Substance and Sexual Activity   Alcohol use: Not Currently    Alcohol/week: 0.0 standard drinks of alcohol    Comment: Rarely   Drug use: No   Sexual activity: Not Currently

## 2023-02-27 NOTE — Progress Notes (Signed)
See colorectal screening flowsheet for patient history. Explained FIT Test and answered patient questions. FIT test given to patient to complete at home.

## 2023-02-28 ENCOUNTER — Other Ambulatory Visit: Payer: BC Managed Care – PPO

## 2023-02-28 DIAGNOSIS — E7800 Pure hypercholesterolemia, unspecified: Secondary | ICD-10-CM

## 2023-03-01 LAB — LIPID PANEL
Chol/HDL Ratio: 3 ratio (ref 0.0–5.0)
Cholesterol, Total: 132 mg/dL (ref 100–199)
HDL: 44 mg/dL (ref >39)
LDL Chol Calc (NIH): 75 mg/dL (ref 0–99)
Triglycerides: 59 mg/dL (ref 0–149)
VLDL Cholesterol Cal: 13 mg/dL (ref 5–40)

## 2023-03-02 NOTE — Progress Notes (Signed)
Troy Estrada,   Cholesterol looks so much better!! LDL is 75!

## 2023-03-03 ENCOUNTER — Other Ambulatory Visit: Payer: Self-pay | Admitting: Oncology

## 2023-03-09 LAB — FECAL OCCULT BLOOD, IMMUNOCHEMICAL: Fecal Occult Bld: NEGATIVE

## 2023-03-28 ENCOUNTER — Encounter: Payer: Self-pay | Admitting: Urology

## 2023-04-24 ENCOUNTER — Ambulatory Visit (INDEPENDENT_AMBULATORY_CARE_PROVIDER_SITE_OTHER): Payer: Self-pay | Admitting: Physician Assistant

## 2023-04-24 ENCOUNTER — Encounter: Payer: Self-pay | Admitting: Physician Assistant

## 2023-04-24 ENCOUNTER — Other Ambulatory Visit: Payer: Self-pay

## 2023-04-24 VITALS — BP 110/80 | HR 81 | Ht 70.0 in | Wt 177.0 lb

## 2023-04-24 DIAGNOSIS — R040 Epistaxis: Secondary | ICD-10-CM

## 2023-04-24 DIAGNOSIS — L0100 Impetigo, unspecified: Secondary | ICD-10-CM

## 2023-04-24 MED ORDER — MUPIROCIN 2 % EX OINT: 1.0000 | TOPICAL_OINTMENT | Freq: Two times a day (BID) | CUTANEOUS | 0 refills | Status: AC

## 2023-04-24 NOTE — Progress Notes (Signed)
[facility] [address] [place], [place] [address]   Office Visit Note  Patient Name: Troy Estrada Date of Birth [id number]  Medical Record number [id number]  Date of Service: 04/24/2023  Chief Complaint  Patient presents with   Acute Visit    Patient c/o a scab in right nostril that he has not been healing.       32 y/o M presents to the clinic for c/o nasal congestion and occasional epistaxis  over the past month or so. Noticed a dry scab in the right nasal passage. NO other symptoms currently.       Current Medication:  Outpatient Encounter Medications as of 04/24/2023  Medication Sig   Albuterol Sulfate (PROAIR RESPICLICK) 108 (90 Base) MCG/ACT AEPB Inhale 1-2 puffs 30 mins prior to aerobic exercise; repeat 1-2 puff Q4H prn SOB/wheezing/bronchospasm   atorvastatin (LIPITOR) 10 MG tablet Take 1 tablet (10 mg total) by mouth at bedtime.   mupirocin ointment (BACTROBAN) 2 % Apply 1 Application topically 2 (two) times daily.   No facility-administered encounter medications on file as of 04/24/2023.      Medical History: Past Medical History:  Diagnosis Date   Elevated LDL cholesterol level 12/05/2015   Exercise-induced asthma 11/24/2015   Pilonidal cyst with abscess 09/06/2018     Vital Signs: BP 110/80   Pulse 81   Ht 5\' 10"  (1.778 m)   Wt 177 lb (80.3 kg)   SpO2 97%   BMI 25.40 kg/m    Review of Systems  Constitutional: Negative.   HENT:  Positive for congestion and nosebleeds. Negative for sinus pressure, sinus pain, sore throat and trouble swallowing.   Cardiovascular: Negative.   Neurological: Negative.     Physical Exam Constitutional:      Appearance: Normal appearance.  HENT:     Head: Atraumatic.     Nose: Mucosal edema present. No nasal deformity or septal deviation.     Right Nostril: No foreign body, epistaxis or septal hematoma.     Left Nostril: No foreign body, epistaxis or septal hematoma.     Right Turbinates: Enlarged.      Left Turbinates: Enlarged.     Right Sinus: No maxillary sinus tenderness or frontal sinus tenderness.     Left Sinus: No maxillary sinus tenderness or frontal sinus tenderness.     Comments: No healing or healed wound appreciated in the right nasal passage, but definite edema and erythema of the mucosa on the right.     Mouth/Throat:     Mouth: Mucous membranes are moist.     Pharynx: Oropharynx is clear.  Eyes:     Extraocular Movements: Extraocular movements intact.  Musculoskeletal:     Cervical back: Neck supple.  Skin:    General: Skin is warm.  Neurological:     Mental Status: He is alert.  Psychiatric:        Mood and Affect: Mood normal.        Behavior: Behavior normal.        Thought Content: Thought content normal.        Judgment: Judgment normal.       Assessment/Plan:  1. Impetigo (Primary) - mupirocin ointment (BACTROBAN) 2 %; Apply 1 Application topically 2 (two) times daily.  Dispense: 22 g; Refill: 0  2. Epistaxis  Apply ointment as prescribed if not healing well. Continue to watch for worsening symptoms. RTC prn Pt verbalized understanding and in agreement.    General Counseling: Mrk verbalizes understanding  of the findings of todays visit and agrees with plan of treatment. I have discussed any further diagnostic evaluation that may be needed or ordered today. We also reviewed his medications today. he has been encouraged to call the office with any questions or concerns that should arise related to todays visit.    Time spent:20 Minutes    Gilberto Better, [place] Physician Assistant

## 2023-04-27 ENCOUNTER — Encounter: Payer: Self-pay | Admitting: Physician Assistant

## 2023-04-27 DIAGNOSIS — J45990 Exercise induced bronchospasm: Secondary | ICD-10-CM

## 2023-04-27 MED ORDER — PROAIR RESPICLICK 108 (90 BASE) MCG/ACT IN AEPB: INHALATION_SPRAY | RESPIRATORY_TRACT | 1 refills | Status: AC

## 2023-06-10 ENCOUNTER — Encounter: Payer: Self-pay | Admitting: *Deleted

## 2023-06-10 ENCOUNTER — Ambulatory Visit
Admission: EM | Admit: 2023-06-10 | Discharge: 2023-06-10 | Disposition: A | Discharge status: Home / Self Care | Payer: BC Managed Care – PPO | Attending: Emergency Medicine | Admitting: Emergency Medicine

## 2023-06-10 ENCOUNTER — Other Ambulatory Visit: Payer: Self-pay

## 2023-06-10 DIAGNOSIS — Z20828 Contact with and (suspected) exposure to other viral communicable diseases: Secondary | ICD-10-CM

## 2023-06-10 MED ORDER — OSELTAMIVIR PHOSPHATE 75 MG PO CAPS: 75.0000 mg | ORAL_CAPSULE | Freq: Two times a day (BID) | ORAL | 0 refills | Status: AC

## 2023-06-10 NOTE — ED Triage Notes (Signed)
Requesting influenza test; spouse is currently positive, and both children are ill. Reports slight HA today, otherwise no other sxs.

## 2023-06-10 NOTE — ED Provider Notes (Signed)
[facility]    CSN: [id number] Arrival date & time: 06/10/23  1359    HISTORY   Chief Complaint  Patient presents with   Influenza Test   HPI Troy Estrada is a pleasant, 33 y.o. male who presents to urgent care today. Patient states his wife tested positive for influenza A.  Patient states both of his children are showing signs of influenza.  Patient reports mild headache but otherwise is asymptomatic.  The history is provided by the patient.   Past Medical History:  Diagnosis Date   Elevated LDL cholesterol level 12/05/2015   Exercise-induced asthma 11/24/2015   Pilonidal cyst with abscess 09/06/2018   Patient Active Problem List   Diagnosis Date Noted   Panic attacks 02/22/2022   History of 2019 novel coronavirus disease (COVID-19) 11/23/2020   Onychomycosis 10/12/2020   Seasonal allergies 10/12/2020   Dermatitis 10/12/2020   Sensation of lump in throat 08/19/2020   Tickle in throat 08/19/2020   Hernia, inguinal, right 05/14/2019   Pilonidal cyst 05/14/2019   Pilonidal cyst with abscess 09/06/2018   Elevated LDL cholesterol level 12/05/2015   Exercise-induced asthma 11/24/2015   Past Surgical History:  Procedure Laterality Date   PILONIDAL CYST DRAINAGE N/A 09/05/2018   In office I&D   PILONIDAL CYST EXCISION N/A 07/31/2019   Procedure: PILONIDAL CYSTECTOMY;  Surgeon: Manus Rudd, MD;  Location: [facility];  Service: General;  Laterality: N/A;   WISDOM TOOTH EXTRACTION      Home Medications    Prior to Admission medications   Medication Sig Start Date End Date Taking? Authorizing Provider  atorvastatin (LIPITOR) 10 MG tablet Take 1 tablet (10 mg total) by mouth at bedtime. 06/19/22  Yes Breeback, Jade L, PA-C  oseltamivir (TAMIFLU) 75 MG capsule Take 1 capsule (75 mg total) by mouth every 12 (twelve) hours for 5 days. 06/10/23 06/15/23 Yes Theadora Rama Scales, PA-C  Albuterol Sulfate (PROAIR RESPICLICK) 108 (90 Base) MCG/ACT AEPB Inhale 1-2 puffs 30 mins  prior to aerobic exercise; repeat 1-2 puff Q4H prn SOB/wheezing/bronchospasm 04/27/23   Breeback, Jade L, PA-C  mupirocin ointment (BACTROBAN) 2 % Apply 1 Application topically 2 (two) times daily. 04/24/23   Gilberto Better, PA-C    Family History Family History  Problem Relation Age of Onset   Depression Mother    Hyperlipidemia Mother    Hypertension Mother    Depression Father    Diabetes Father    Hyperlipidemia Father    Depression Sister    Depression Sister    Bipolar disorder Sister    Depression Brother    Cancer Maternal Grandmother        cervical   Diabetes Maternal Grandmother    Hypertension Maternal Grandmother    Alzheimer's disease Maternal Grandmother    Cancer Maternal Grandfather        prostate   Hypertension Maternal Grandfather    Dementia Paternal Grandfather    Diabetes Maternal Uncle    Social History Social History   Tobacco Use   Smoking status: Never   Smokeless tobacco: Never  Vaping Use   Vaping status: Never Used  Substance Use Topics   Alcohol use: Not Currently   Drug use: No   Allergies   Mucinex [guaifenesin er] and Doxycycline  Review of Systems Review of Systems Pertinent findings revealed after performing a 14 point review of systems has been noted in the history of present illness.  Physical Exam Vital Signs BP 107/74   Pulse 82   Temp  98.1 F (36.7 C) (Oral)   Resp 16   SpO2 96%   No data found.  Physical Exam Vitals and nursing note reviewed.  Constitutional:      General: He is not in acute distress.    Appearance: Normal appearance. He is normal weight. He is not ill-appearing.  HENT:     Head: Normocephalic and atraumatic.     Right Ear: Tympanic membrane, ear canal and external ear normal.     Left Ear: Tympanic membrane, ear canal and external ear normal.     Nose: Nose normal.     Mouth/Throat:     Mouth: Mucous membranes are moist.     Pharynx: Oropharynx is clear. No posterior oropharyngeal  erythema.  Eyes:     Extraocular Movements: Extraocular movements intact.     Conjunctiva/sclera: Conjunctivae normal.     Pupils: Pupils are equal, round, and reactive to light.  Cardiovascular:     Rate and Rhythm: Normal rate and regular rhythm.  Pulmonary:     Effort: Pulmonary effort is normal.     Breath sounds: Normal breath sounds.  Musculoskeletal:        General: Normal range of motion.     Cervical back: Normal range of motion and neck supple.  Skin:    General: Skin is warm and dry.  Neurological:     General: No focal deficit present.     Mental Status: He is alert and oriented to person, place, and time. Mental status is at baseline.  Psychiatric:        Mood and Affect: Mood normal.        Behavior: Behavior normal.        Thought Content: Thought content normal.        Judgment: Judgment normal.     Visual Acuity Right Eye Distance:   Left Eye Distance:   Bilateral Distance:    Right Eye Near:   Left Eye Near:    Bilateral Near:     UC Couse / Diagnostics / Procedures:     Radiology No results found.  Procedures Procedures (including critical care time) EKG  Pending results:  Labs Reviewed - No data to display  Medications Ordered in UC: Medications - No data to display  UC Diagnoses / Final Clinical Impressions(s)   I have reviewed the triage vital signs and the nursing notes.  Pertinent labs & imaging results that were available during my care of the patient were reviewed by me and considered in my medical decision making (see chart for details).    Final diagnoses:  Exposure to influenza   Patient provided with prescription for Tamiflu for influenza prophylaxis.  Conservative care recommended.  Return precautions advised.  Please see discharge instructions below for details of plan of care as provided to patient. ED Prescriptions     Medication Sig Dispense Auth. Provider   oseltamivir (TAMIFLU) 75 MG capsule Take 1 capsule (75 mg  total) by mouth every 12 (twelve) hours for 5 days. 10 capsule Theadora Rama Scales, PA-C      PDMP not reviewed this encounter.  Pending results:  Labs Reviewed - No data to display    Discharge Instructions      Please begin Tamiflu by taking 1 capsule twice daily for the next 5 days.  Thank you for visiting [facility] today.    Disposition Upon Discharge:  Condition: stable for discharge home  Patient presented with an acute illness with associated systemic  symptoms and significant discomfort requiring urgent management. In my opinion, this is a condition that a prudent lay person (someone who possesses an average knowledge of health and medicine) may potentially expect to result in complications if not addressed urgently such as respiratory distress, impairment of bodily function or dysfunction of bodily organs.   Routine symptom specific, illness specific and/or disease specific instructions were discussed with the patient and/or caregiver at length.   As such, the patient has been evaluated and assessed, work-up was performed and treatment was provided in alignment with urgent care protocols and evidence based medicine.  Patient/parent/caregiver has been advised that the patient may require follow up for further testing and treatment if the symptoms continue in spite of treatment, as clinically indicated and appropriate.  Patient/parent/caregiver has been advised to return to the [facility] or PCP if no better; to PCP or the Emergency Department if new signs and symptoms develop, or if the current signs or symptoms continue to change or worsen for further workup, evaluation and treatment as clinically indicated and appropriate  The patient will follow up with their current PCP if and as advised. If the patient does not currently have a PCP we will assist them in obtaining one.   The patient may need specialty follow up if the symptoms continue, in spite of conservative  treatment and management, for further workup, evaluation, consultation and treatment as clinically indicated and appropriate.  Patient/parent/caregiver verbalized understanding and agreement of plan as discussed.  All questions were addressed during visit.  Please see discharge instructions below for further details of plan.  This office note has been dictated using Teaching laboratory technician.  Unfortunately, this method of dictation can sometimes lead to typographical or grammatical errors.  I apologize for your inconvenience in advance if this occurs.  Please do not hesitate to reach out to me if clarification is needed.      Theadora Rama Scales, [place] 06/10/23 1415

## 2023-06-10 NOTE — Discharge Instructions (Signed)
Please begin Tamiflu by taking 1 capsule twice daily for the next 5 days.  Thank you for visiting [facility] today.

## 2023-06-16 ENCOUNTER — Other Ambulatory Visit: Payer: Self-pay | Admitting: Physician Assistant

## 2023-06-16 DIAGNOSIS — E7800 Pure hypercholesterolemia, unspecified: Secondary | ICD-10-CM

## 2023-06-28 ENCOUNTER — Encounter: Payer: Self-pay | Admitting: Physician Assistant

## 2023-06-28 DIAGNOSIS — R0981 Nasal congestion: Secondary | ICD-10-CM | POA: Diagnosis not present

## 2023-06-28 DIAGNOSIS — J069 Acute upper respiratory infection, unspecified: Secondary | ICD-10-CM | POA: Diagnosis not present

## 2023-06-28 DIAGNOSIS — Z20828 Contact with and (suspected) exposure to other viral communicable diseases: Secondary | ICD-10-CM | POA: Diagnosis not present

## 2023-06-28 DIAGNOSIS — R5383 Other fatigue: Secondary | ICD-10-CM | POA: Diagnosis not present

## 2023-07-07 DIAGNOSIS — K529 Noninfective gastroenteritis and colitis, unspecified: Secondary | ICD-10-CM | POA: Diagnosis not present

## 2023-07-07 DIAGNOSIS — R112 Nausea with vomiting, unspecified: Secondary | ICD-10-CM | POA: Diagnosis not present

## 2023-07-07 DIAGNOSIS — R509 Fever, unspecified: Secondary | ICD-10-CM | POA: Diagnosis not present

## 2023-07-07 DIAGNOSIS — R110 Nausea: Secondary | ICD-10-CM | POA: Diagnosis not present

## 2023-07-18 ENCOUNTER — Ambulatory Visit (INDEPENDENT_AMBULATORY_CARE_PROVIDER_SITE_OTHER): Payer: Self-pay | Admitting: Oncology

## 2023-07-18 ENCOUNTER — Encounter: Payer: Self-pay | Admitting: Oncology

## 2023-07-18 VITALS — BP 125/81 | HR 72 | Temp 97.6°F | Ht 70.0 in | Wt 177.0 lb

## 2023-07-18 DIAGNOSIS — R0981 Nasal congestion: Secondary | ICD-10-CM

## 2023-07-18 LAB — POC SOFIA 2 FLU + SARS ANTIGEN FIA
Influenza A, POC: NEGATIVE
Influenza B, POC: NEGATIVE
SARS Coronavirus 2 Ag: NEGATIVE

## 2023-07-18 MED ORDER — FLUTICASONE PROPIONATE 50 MCG/ACT NA SUSP: 2.0000 | Freq: Every day | NASAL | 6 refills | Status: AC

## 2023-07-18 NOTE — Progress Notes (Signed)
 [facility]  [address] [place], [place] [address] Phone: [phone] Fax: [phone]   Office Visit Note  Patient Name: Troy Estrada  Date of [id number]  Med Rec number [id number]  Date of Service: 07/18/2023  Mucinex [guaifenesin er] and Doxycycline  Chief Complaint  Patient presents with   Sick visit    Patient c/o hoarseness and dry cough. Symptoms began about 3 days ago. He states his wife, son, and in-laws were recently diagnosed with the flu A. He has not tried any OTC meds.     HPI Patient is an 33 y.o. year old who presents with sinus like issues. Son/Wife had a flu last week and wife teaches in elementary school. Parents t also tested positive for the flu on Monday.  Developed nasal congestion and sore throat on Sunday evening with mild cough and sputum production.  Voice is raspy.  Has history of asthma and felt mildly short of breath last night.  Has some postnasal drainage.  No fevers, body aches.  Having to take frequent mental breaks during his day due to occasional headache.  Nothing over-the-counter.  Has been drinking warm liquids which has helped some with his throat.  Uses his albuterol inhaler periodically.  No recent antibiotics.  Does have an allergy to doxycycline and guaifenesin.  Current Medication:  Outpatient Encounter Medications as of 07/18/2023  Medication Sig   Albuterol Sulfate (PROAIR RESPICLICK) 108 (90 Base) MCG/ACT AEPB Inhale 1-2 puffs 30 mins prior to aerobic exercise; repeat 1-2 puff Q4H prn SOB/wheezing/bronchospasm   atorvastatin (LIPITOR) 10 MG tablet TAKE ONE TABLET BY MOUTH DAILY AT BEDTIME   co-enzyme Q-10 30 MG capsule Take 30 mg by mouth daily.   fluticasone (FLONASE) 50 MCG/ACT nasal spray Place 2 sprays into both nostrils daily.   mupirocin ointment (BACTROBAN) 2 % Apply 1 Application topically 2 (two) times daily. (Patient not taking: Reported on 07/18/2023)   No facility-administered encounter medications on file  as of 07/18/2023.      Medical History: Past Medical History:  Diagnosis Date   Elevated LDL cholesterol level 12/05/2015   Exercise-induced asthma 11/24/2015   Pilonidal cyst with abscess 09/06/2018   Vital Signs: BP 125/81   Pulse 72   Temp 97.6 F (36.4 C)   Ht 5\' 10"  (1.778 m)   Wt 177 lb (80.3 kg)   SpO2 97%   BMI 25.40 kg/m   ROS: As per HPI.  All other pertinent ROS negative.     Review of Systems  Constitutional:  Positive for fatigue. Negative for chills and diaphoresis.  HENT:  Positive for congestion, postnasal drip, sore throat and voice change. Negative for ear pain, sinus pressure and sinus pain.   Respiratory:  Positive for cough and shortness of breath.   Gastrointestinal:  Negative for constipation, diarrhea, nausea and vomiting.  Musculoskeletal:  Negative for myalgias.  Neurological:  Positive for headaches. Negative for dizziness.    Physical Exam Vitals reviewed.  Constitutional:      Appearance: Normal appearance.  HENT:     Right Ear: A middle ear effusion is present.     Left Ear: A middle ear effusion is present.     Nose: Mucosal edema, congestion and rhinorrhea present. No nasal tenderness.     Right Turbinates: Swollen.     Left Turbinates: Swollen.     Right Sinus: No maxillary sinus tenderness or frontal sinus tenderness.     Left Sinus: No maxillary sinus tenderness or frontal sinus  tenderness.     Mouth/Throat:     Mouth: Mucous membranes are moist.     Pharynx: Postnasal drip present.     Tonsils: No tonsillar exudate. 0 on the right. 0 on the left.  Cardiovascular:     Rate and Rhythm: Normal rate and regular rhythm.  Pulmonary:     Effort: Pulmonary effort is normal.     Breath sounds: Normal breath sounds.  Chest:    Neurological:     Mental Status: He is alert.     No results found for this or any previous visit (from the past 24 hours).  Assessment/Plan: 1. Sinus congestion (Primary) Flu and COVID were negative.  We  discussed he likely has seasonal allergies versus a viral illness.  Overall he is feeling well.  Afebrile lung clinic.  Recommend OTC medications given he has not tried anything yet.  We discussed Flonase 2 sprays each nostril once or twice a day, and antihistamine daily like Zyrtec or Claritin along with a decongestant as needed.  Respiratory exam was benign.  Discussed if symptoms worsened over the next 24 to 48 hours to let me know.  General Counseling: Arvie verbalizes understanding of the findings of todays visit and agrees with plan of treatment. I have discussed any further diagnostic evaluation that may be needed or ordered today. We also reviewed his medications today. he has been encouraged to call the office with any questions or concerns that should arise related to todays visit.   Orders Placed This Encounter  Procedures   POC SOFIA 2 FLU + SARS ANTIGEN FIA    Meds ordered this encounter  Medications   fluticasone (FLONASE) 50 MCG/ACT nasal spray    Sig: Place 2 sprays into both nostrils daily.    Dispense:  16 g    Refill:  6    I spent 25 minutes dedicated to the care of this patient (face-to-face and non-face-to-face) on the date of the encounter to include what is described in the assessment and plan.   Durenda Hurt, NP 07/18/2023 1:44 PM

## 2023-10-15 DIAGNOSIS — M79672 Pain in left foot: Secondary | ICD-10-CM | POA: Diagnosis not present

## 2023-10-30 DIAGNOSIS — M79672 Pain in left foot: Secondary | ICD-10-CM | POA: Diagnosis not present

## 2023-11-07 ENCOUNTER — Encounter: Payer: Self-pay | Admitting: Oncology

## 2023-11-07 ENCOUNTER — Ambulatory Visit (INDEPENDENT_AMBULATORY_CARE_PROVIDER_SITE_OTHER): Payer: Self-pay | Admitting: Oncology

## 2023-11-07 ENCOUNTER — Other Ambulatory Visit: Payer: Self-pay

## 2023-11-07 VITALS — BP 140/85 | HR 98 | Temp 99.4°F | Ht 70.0 in

## 2023-11-07 DIAGNOSIS — R002 Palpitations: Secondary | ICD-10-CM

## 2023-11-07 NOTE — Progress Notes (Signed)
 [facility]  [address] [place], [place] [address] Phone: [phone] Fax: [phone]   Office Visit Note  Patient Name: Troy Estrada  Date of Apmuy:[id number]  Med Rec number [id number]  Date of Service: 11/07/2023  Mucinex [guaifenesin er] and Doxycycline   Chief Complaint  Patient presents with   Palpitations    HPI Patient is an 33 y.o. who is here for left sided chest palpitations that started a few weeks ago.  Reports they are intermittent and lasting only a few seconds.  Reports more recently he has been taking deep breaths and feeling a pain in his lower back rotating from side-to-side.  He reports increased anxiety at home due to his wife recently having a total hysterectomy.  She is unable to do any heavy lifting and they have 2 toddlers at home.  He has been doing most of the picking up of the children which can be contributing as well.  He has not taken anything over-the-counter.  He is on cholesterol medicine for high cholesterol.  Reports his wife feels something similar and thinks it may be viral.  He has some sinus tenderness but no cough, fever or shortness of breath.  He does have anxiety and is not currently on medication.  Reports it is fairly common for him to not drink enough fluids throughout the day because he forgets.  He has to remind himself to drink more water.  Current Medication:  Outpatient Encounter Medications as of 11/07/2023  Medication Sig   Albuterol  Sulfate (PROAIR  RESPICLICK) 108 (90 Base) MCG/ACT AEPB Inhale 1-2 puffs 30 mins prior to aerobic exercise; repeat 1-2 puff Q4H prn SOB/wheezing/bronchospasm   atorvastatin  (LIPITOR) 10 MG tablet TAKE ONE TABLET BY MOUTH DAILY AT BEDTIME   co-enzyme Q-10 30 MG capsule Take 30 mg by mouth daily.   fluticasone  (FLONASE ) 50 MCG/ACT nasal spray Place 2 sprays into both nostrils daily. (Patient taking differently: Place 2 sprays into both nostrils daily as needed.)   mupirocin  ointment (BACTROBAN )  2 % Apply 1 Application topically 2 (two) times daily. (Patient not taking: Reported on 11/07/2023)   No facility-administered encounter medications on file as of 11/07/2023.   Medical History: Past Medical History:  Diagnosis Date   Elevated LDL cholesterol level 12/05/2015   Exercise-induced asthma 11/24/2015   Pilonidal cyst with abscess 09/06/2018     Vital Signs: BP (!) 140/85   Pulse 98   Temp 99.4 F (37.4 C)   Ht 5' 10 (1.778 m)   SpO2 97%   BMI 25.40 kg/m   ROS: As per HPI.  All other pertinent ROS negative.     Review of Systems  Respiratory:  Negative for cough, chest tightness and shortness of breath.   Cardiovascular:  Positive for chest pain and palpitations.  Musculoskeletal:  Positive for back pain.    Physical Exam Constitutional:      Appearance: Normal appearance.  Cardiovascular:     Rate and Rhythm: Normal rate and regular rhythm.     Comments: EKG NSR. HR WNL.  Pulmonary:     Effort: Pulmonary effort is normal.     Breath sounds: Normal breath sounds.  Neurological:     Mental Status: He is alert.     No results found for this or any previous visit (from the past 24 hours).  Assessment/Plan: 1. Palpitations (Primary) EKG showed normal sinus rhythm.  We discussed blood work today to rule out electrolyte abnormality.  We discussed going back to clinic  should symptoms worsen or become more persistent.  We also discussed indications for ED visits.  Will send referral to cardiology in case symptoms are persistent and he needs a heart monitor.  We discussed possible musculoskeletal given he has been doing all the heavy lifting at home.  Recommend ibuprofen  3-4 each morning and 3-4 at bedtime to see if that was helpful.  Discussed ways to reduce anxiety.  Will send results via MyChart when available.  - EKG 12-Lead - CBC with Differential/Platelet - Comp Met (CMET) - Magnesium - Ambulatory referral to Cardiology   General Counseling: [name] verbalizes  understanding of the findings of todays visit and agrees with plan of treatment. I have discussed any further diagnostic evaluation that may be needed or ordered today. We also reviewed his medications today. he has been encouraged to call the office with any questions or concerns that should arise related to todays visit.   No orders of the defined types were placed in this encounter.   No orders of the defined types were placed in this encounter.   I spent 20 minutes dedicated to the care of this patient (face-to-face and non-face-to-face) on the date of the encounter to include what is described in the assessment and plan.   Delon Hope, NP 11/07/2023 1:36 PM

## 2023-11-08 LAB — COMPREHENSIVE METABOLIC PANEL WITH GFR
ALT: 19 IU/L (ref 0–44)
AST: 19 IU/L (ref 0–40)
Albumin: 4.9 g/dL (ref 4.1–5.1)
Alkaline Phosphatase: 162 IU/L — ABNORMAL HIGH (ref 44–121)
BUN/Creatinine Ratio: 15 (ref 9–20)
BUN: 16 mg/dL (ref 6–20)
Bilirubin Total: 0.2 mg/dL (ref 0.0–1.2)
CO2: 21 mmol/L (ref 20–29)
Calcium: 9.5 mg/dL (ref 8.7–10.2)
Chloride: 101 mmol/L (ref 96–106)
Creatinine, Ser: 1.06 mg/dL (ref 0.76–1.27)
Globulin, Total: 2.6 g/dL (ref 1.5–4.5)
Glucose: 140 mg/dL — ABNORMAL HIGH (ref 70–99)
Potassium: 4.1 mmol/L (ref 3.5–5.2)
Sodium: 138 mmol/L (ref 134–144)
Total Protein: 7.5 g/dL (ref 6.0–8.5)
eGFR: 96 mL/min/{1.73_m2} (ref >59)

## 2023-11-08 LAB — CBC WITH DIFFERENTIAL/PLATELET
Basophils Absolute: 0 10*3/uL (ref 0.0–0.2)
Basos: 1 %
EOS (ABSOLUTE): 0 10*3/uL (ref 0.0–0.4)
Eos: 0 %
Hematocrit: 40.5 % (ref 37.5–51.0)
Hemoglobin: 13.6 g/dL (ref 13.0–17.7)
Immature Grans (Abs): 0 10*3/uL (ref 0.0–0.1)
Immature Granulocytes: 0 %
Lymphocytes Absolute: 0.9 10*3/uL (ref 0.7–3.1)
Lymphs: 15 %
MCH: 30 pg (ref 26.6–33.0)
MCHC: 33.6 g/dL (ref 31.5–35.7)
MCV: 89 fL (ref 79–97)
Monocytes Absolute: 0.5 10*3/uL (ref 0.1–0.9)
Monocytes: 9 %
Neutrophils Absolute: 4.5 10*3/uL (ref 1.4–7.0)
Neutrophils: 75 %
Platelets: 248 10*3/uL (ref 150–450)
RBC: 4.54 x10E6/uL (ref 4.14–5.80)
RDW: 12.3 % (ref 11.6–15.4)
WBC: 5.9 10*3/uL (ref 3.4–10.8)

## 2023-11-08 LAB — MAGNESIUM: Magnesium: 2 mg/dL (ref 1.6–2.3)

## 2023-11-08 LAB — TSH: TSH: 1.14 u[IU]/mL (ref 0.450–4.500)

## 2023-11-12 ENCOUNTER — Encounter: Payer: Self-pay | Admitting: Oncology

## 2023-11-13 ENCOUNTER — Ambulatory Visit: Payer: Self-pay | Admitting: Oncology

## 2023-12-08 NOTE — Progress Notes (Unsigned)
 Cardiology Clinic Note   Date: 12/10/2023 ID: Eh Sesay, DOB [birth year], MRN [id number]  Primary Care Provider: Antoniette Vermell LITTIE DEVONNA   Primary Cardiologist:  None  Chief Complaint   Troy Estrada is a 33 y.o. male who presents to the clinic today for new patient visit for palpitations.   Patient Profile      Past medical history significant for: Exercise-induced asthma. Hyperlipidemia. Lipid panel 02/28/2023: LDL 75, HDL 44, TG 59, total 132.  In summary, patient was evaluated by Delon Hope, NP at [facility] on 11/07/2023 for palpitations.  EKG was reported as normal sinus rhythm.  He was referred to cardiology.     History of Present Illness    Today, patient reports palpitations described as a skip lasting seconds and resolving on its own. He will sometimes have several episodes an hour throughout the day. He reports when he was seen by [facility] he had mild upper abdominal pain. He mentioned that he was having palpitations at that time. He has not had any palpitations since being seen on 7/2. He wonders if his symptoms could be related to anxiety. His wife had a hysterectomy on 6/2 and he was taking care of his 2 children, ages 1 and 3, as well as his wife and some things around the house. He had some back pain that was felt to be musculoskeletal. He denies shortness of breath or chest pain. Prior to his wife's hysterectomy he was starting to run again and had been running once a week. He since stopped this due to his wife's surgery. 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 His parents have hypertension and hyperlipidemia and his father has diabetes or prediabetes (he is unsure).     ROS: All other systems reviewed and are otherwise negative except as noted in History of Present Illness.  EKGs/Labs Reviewed    EKG Interpretation Date/Time:  Monday December 10 2023 15:09:11 EDT Ventricular Rate:  67 PR Interval:  152 QRS Duration:  80 QT Interval:  376 QTC Calculation: 397 R Axis:   39  Text Interpretation: Normal sinus rhythm Normal ECG When compared with ECG of 18-Nov-2020 05:43, PREVIOUS ECG IS PRESENT Confirmed by Loistine Sober [phone]) on 12/10/2023 3:13:29 PM   11/07/2023: ALT 19; AST 19; BUN 16; Creatinine, Ser 1.06; Potassium 4.1; Sodium 138   11/07/2023: Hemoglobin 13.6; WBC 5.9   11/07/2023: TSH 1.140   Physical Exam    VS:  BP 110/62 (BP Location: Right Arm, Patient Position: Sitting, Cuff Size: Normal)   Pulse 67   Ht 5' 10 (1.778 m)   Wt 175 lb 8 oz (79.6 kg)   SpO2 98%   BMI 25.18 kg/m  , BMI Body mass index is 25.18 kg/m.  GEN: Well nourished, well developed, in no acute distress. Neck: No JVD or carotid bruits. Cardiac:  RRR.  No murmur. No rubs or gallops.   Respiratory:  Respirations regular and unlabored. Clear to auscultation without rales, wheezing or rhonchi. GI: Soft, nontender, nondistended. Extremities: Radials/DP/PT 2+ and equal bilaterally. No clubbing or cyanosis. No edema.  Skin: Warm and dry, no rash. Neuro: Strength intact.  Assessment & Plan   Palpitations Patient reports palpitations described as skipping lasting seconds and resolving on its own. He has not had these episodes for the last month. He feels it could have been related to anxiety, as his wife had  surgery the beginning of June and he has been caring for her and their 2 kids, ages 1 and 3. EKG shows NSR today. Given absence of symptoms for the last month will defer outpatient monitoring.  - Patient is instructed to contact the office if palpitations begin again and 14 day Zio can be ordered at that time.   Hyperlipidemia LDL 75 October 2024, at goal. - Continue atorvastatin .  Disposition: Return as needed.          Signed, Barnie HERO. Ishita Mcnerney, DNP, NP-C

## 2023-12-10 ENCOUNTER — Ambulatory Visit: Attending: Student | Admitting: Student

## 2023-12-10 ENCOUNTER — Encounter: Payer: Self-pay | Admitting: Student

## 2023-12-10 VITALS — BP 110/62 | HR 67 | Ht 70.0 in | Wt 175.5 lb

## 2023-12-10 DIAGNOSIS — R002 Palpitations: Secondary | ICD-10-CM

## 2023-12-10 DIAGNOSIS — E7800 Pure hypercholesterolemia, unspecified: Secondary | ICD-10-CM | POA: Diagnosis not present

## 2023-12-10 NOTE — Patient Instructions (Signed)
 Medication Instructions:   Your physician recommends that you continue on your current medications as directed. Please refer to the Current Medication list given to you today.   *If you need a refill on your cardiac medications before your next appointment, please call your pharmacy*  Lab Work:  No labs ordered today   If you have labs (blood work) drawn today and your tests are completely normal, you will receive your results only by: MyChart Message (if you have MyChart) OR A paper copy in the mail If you have any lab test that is abnormal or we need to change your treatment, we will call you to review the results.  Testing/Procedures:  No test ordered today   Follow-Up: At [facility], you and your health needs are our priority.  As part of our continuing mission to provide you with exceptional heart care, our providers are all part of one team.  This team includes your primary Cardiologist (physician) and Advanced Practice Providers or APPs (Physician Assistants and Nurse Practitioners) who all work together to provide you with the care you need, when you need it.  Your next appointment:     Follow Up as Needed  Provider:   Barnie Hila, NP

## 2023-12-24 ENCOUNTER — Other Ambulatory Visit: Payer: Self-pay | Admitting: *Deleted

## 2023-12-24 ENCOUNTER — Ambulatory Visit: Attending: Student

## 2023-12-24 ENCOUNTER — Other Ambulatory Visit: Payer: Self-pay | Admitting: Medical Genetics

## 2023-12-24 DIAGNOSIS — R002 Palpitations: Secondary | ICD-10-CM

## 2023-12-24 NOTE — Progress Notes (Signed)
 Loistine Sober, NP to Me    12/24/23 10:04 AM Please order a 14 day Zio for palpitations.    Thank you!   DW

## 2024-01-03 ENCOUNTER — Other Ambulatory Visit
Admission: RE | Admit: 2024-01-03 | Discharge: 2024-01-03 | Disposition: A | Discharge status: Home / Self Care | Payer: Self-pay | Source: Ambulatory Visit | Attending: Medical Genetics | Admitting: Medical Genetics

## 2024-01-14 ENCOUNTER — Ambulatory Visit: Admitting: Cardiovascular Disease

## 2024-01-15 DIAGNOSIS — R002 Palpitations: Secondary | ICD-10-CM | POA: Diagnosis not present

## 2024-01-15 LAB — GENECONNECT MOLECULAR SCREEN: Genetic Analysis Overall Interpretation: NEGATIVE

## 2024-01-20 ENCOUNTER — Encounter: Payer: Self-pay | Admitting: Physician Assistant

## 2024-01-20 DIAGNOSIS — Z79899 Other long term (current) drug therapy: Secondary | ICD-10-CM

## 2024-01-20 DIAGNOSIS — M791 Myalgia, unspecified site: Secondary | ICD-10-CM

## 2024-01-22 DIAGNOSIS — R002 Palpitations: Secondary | ICD-10-CM | POA: Diagnosis not present

## 2024-01-23 ENCOUNTER — Ambulatory Visit: Payer: Self-pay | Admitting: Student

## 2024-01-25 ENCOUNTER — Encounter: Payer: Self-pay | Admitting: Emergency Medicine

## 2024-01-25 NOTE — Progress Notes (Signed)
 Letter Sent.

## 2024-01-25 NOTE — Telephone Encounter (Signed)
 Pended labs to send to [facility] fax # [phone]  Do no know what dx code you would like to use

## 2024-01-28 ENCOUNTER — Ambulatory Visit: Admitting: Nurse Practitioner

## 2024-01-28 ENCOUNTER — Other Ambulatory Visit: Payer: Self-pay

## 2024-01-28 ENCOUNTER — Encounter: Payer: Self-pay | Admitting: Nurse Practitioner

## 2024-01-28 VITALS — BP 122/64 | HR 63 | Temp 98.2°F | Resp 18 | Ht 70.0 in | Wt 176.1 lb

## 2024-01-28 DIAGNOSIS — R002 Palpitations: Secondary | ICD-10-CM | POA: Diagnosis not present

## 2024-01-28 DIAGNOSIS — J45990 Exercise induced bronchospasm: Secondary | ICD-10-CM

## 2024-01-28 DIAGNOSIS — Z130 Encounter for screening for diseases of the blood and blood-forming organs and certain disorders involving the immune mechanism: Secondary | ICD-10-CM

## 2024-01-28 DIAGNOSIS — R079 Chest pain, unspecified: Secondary | ICD-10-CM

## 2024-01-28 DIAGNOSIS — M791 Myalgia, unspecified site: Secondary | ICD-10-CM

## 2024-01-28 DIAGNOSIS — J302 Other seasonal allergic rhinitis: Secondary | ICD-10-CM

## 2024-01-28 DIAGNOSIS — Z0000 Encounter for general adult medical examination without abnormal findings: Secondary | ICD-10-CM

## 2024-01-28 DIAGNOSIS — E785 Hyperlipidemia, unspecified: Secondary | ICD-10-CM | POA: Diagnosis not present

## 2024-01-28 DIAGNOSIS — Z0001 Encounter for general adult medical examination with abnormal findings: Secondary | ICD-10-CM | POA: Diagnosis not present

## 2024-01-28 DIAGNOSIS — Z13220 Encounter for screening for lipoid disorders: Secondary | ICD-10-CM

## 2024-01-28 DIAGNOSIS — Z131 Encounter for screening for diabetes mellitus: Secondary | ICD-10-CM

## 2024-01-28 DIAGNOSIS — Z79899 Other long term (current) drug therapy: Secondary | ICD-10-CM

## 2024-01-28 DIAGNOSIS — Z1159 Encounter for screening for other viral diseases: Secondary | ICD-10-CM

## 2024-01-28 DIAGNOSIS — Z7689 Persons encountering health services in other specified circumstances: Secondary | ICD-10-CM

## 2024-01-28 DIAGNOSIS — Z114 Encounter for screening for human immunodeficiency virus [HIV]: Secondary | ICD-10-CM

## 2024-01-28 MED ORDER — OMEPRAZOLE 20 MG PO CPDR: 20.0000 mg | DELAYED_RELEASE_CAPSULE | Freq: Every day | ORAL | 3 refills | Status: AC

## 2024-01-28 NOTE — Progress Notes (Signed)
 Name: Troy Estrada   MRN: [id number]    DOB: 16-Oct-1990   Date:01/28/2024       Progress Note  Subjective  Chief Complaint  Chief Complaint  Patient presents with   Establish Care   Annual Exam   Chest Pain    Is following and getting worked up by cardio    HPI  Discussed the use of AI scribe software for clinical note transcription with the patient, who gave verbal consent to proceed.  History of Present Illness Troy Estrada is a 33 year old male who presents to establish care.  Palpitations and chest discomfort - Intermittent palpitations and chest pain since age 33 - Describes a tightening sensation in the chest and occasional clogging sensation - Symptoms sometimes correlate with food intake, particularly salty noodles or ice cream (experienced twice last week) - Suspects possible relation to sodium intake - Multiple episodes occurred during a stressful week in August - Zio patch and EKG in June were normal - Awaiting results from a recent heart monitor test sent three weeks ago  Myalgia - Muscle pain present, possibly related to recent running activity - Considers possible association with atorvastatin  use, as suggested by family members  Hyperlipidemia - Currently taking atorvastatin  10 mg daily - Dietary modifications underway, choosing chicken over steak to manage cholesterol  Anxiety and panic symptoms - Experiences anxiety, including fear of sickness and intrusive thoughts about death - Symptoms worsened after COVID-19 infection in July - Panic attacks occurred following an allergic reaction to Mucinex - Recognizes tendency to become more anxious when feeling unwell  Physical activity and lifestyle - 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 - Runs occasionally, but overall activity level reduced due to caring for young children - Has two young children, ages almost four and one and a half  Sleep - No significant sleep issues - Averages six to nine hours  of sleep per night      Diet: well balanced  Exercise: runs,  walking often Sleep: 6-8 Last dental exam:6 months ago Last eye exam: 5 years ago  Depression: phq 9 is negative    01/28/2024    8:52 AM 06/19/2022   10:53 AM 02/22/2022    9:39 AM 07/31/2017    8:08 AM  Depression screen PHQ 2/9  Decreased Interest 0 0 0 0  Down, Depressed, Hopeless 0 0 0 0  PHQ - 2 Score 0 0 0 0  Altered sleeping 0   0  Tired, decreased energy 0   0  Change in appetite 0   0  Feeling bad or failure about yourself  0   0  Trouble concentrating 0   0  Moving slowly or fidgety/restless 0   0  Suicidal thoughts 0   0  PHQ-9 Score 0   0  Difficult doing work/chores Not difficult at all   Not difficult at all      01/28/2024    8:53 AM 07/31/2017    8:09 AM  GAD 7 : Generalized Anxiety Score  Nervous, Anxious, on Edge 0 0  Control/stop worrying 0 0  Worry too much - different things 0 0  Trouble relaxing 0 0  Restless 0 0  Easily annoyed or irritable 0 0  Afraid - awful might happen 0 0  Total GAD 7 Score 0 0  Anxiety Difficulty Not difficult at all Not difficult at all      Hypertension:  BP Readings from Last 3 Encounters:  01/28/24 122/64  12/10/23 110/62  11/07/23 (!) 140/85    Obesity: Wt Readings from Last 3 Encounters:  01/28/24 176 lb 1.6 oz (79.9 kg)  12/10/23 175 lb 8 oz (79.6 kg)  07/18/23 177 lb (80.3 kg)   BMI Readings from Last 3 Encounters:  01/28/24 25.27 kg/m  12/10/23 25.18 kg/m  11/07/23 25.40 kg/m     Lipids:  Lab Results  Component Value Date   CHOL 132 02/28/2023   CHOL 238 (H) 06/13/2022   CHOL 252 (H) 02/22/2022   Lab Results  Component Value Date   HDL 44 02/28/2023   HDL 48 06/13/2022   HDL 53 02/22/2022   Lab Results  Component Value Date   LDLCALC 75 02/28/2023   LDLCALC 175 (H) 06/13/2022   LDLCALC 177 (H) 02/22/2022   Lab Results  Component Value Date   TRIG 59 02/28/2023   TRIG 84 06/13/2022   TRIG 103 02/22/2022   Lab  Results  Component Value Date   CHOLHDL 3.0 02/28/2023   CHOLHDL 5.0 06/13/2022   CHOLHDL 4.8 02/22/2022   No results found for: LDLDIRECT Glucose:  Glucose  Date Value Ref Range Status  11/07/2023 140 (H) 70 - 99 mg/dL Final  97/93/7975 97 70 - 99 mg/dL Final   Glucose, Bld  Date Value Ref Range Status  02/22/2022 85 65 - 139 mg/dL Final    Comment:    .        Non-fasting reference interval .   11/18/2020 117 (H) 70 - 99 mg/dL Final    Comment:    Glucose reference range applies only to samples taken after fasting for at least 8 hours.  12/03/2015 94 65 - 99 mg/dL Final    Flowsheet Row Office Visit from 01/28/2024 in [facility]  AUDIT-C Score 0      Married STD testing and prevention (HIV/chl/gon/syphilis): ordered Hep C: ordered  Skin cancer: Discussed monitoring for atypical lesions Colorectal cancer: does not qualify Prostate cancer: does not qualify No results found for: PSA   Lung cancer:   Low Dose CT Chest recommended if Age 37-80 years, 30 pack-year currently smoking OR have quit w/in 15years. Patient does not qualify.   AAA:  The USPSTF recommends one-time screening with ultrasonography in men ages 41 to 38 years who have ever smoked ECG:  12/10/2023  Vaccines:  HPV: up to at age 27 , ask insurance if age between 32-45  Shingrix: 27-64 yo and ask insurance if covered when patient above 66 yo Pneumonia:  educated and discussed with patient. Flu:  educated and discussed with patient.  Advanced Care Planning: A voluntary discussion about advance care planning including the explanation and discussion of advance directives.  Discussed health care proxy and Living will, and the patient was able to identify a health care proxy as wife.  Patient does not have a living will at present time. If patient does have living will, I have requested they bring this to the clinic to be scanned in to their chart.  Patient Active Problem List    Diagnosis Date Noted   Hyperlipidemia 01/28/2024   Panic attacks 02/22/2022   History of 2019 novel coronavirus disease (COVID-19) 11/23/2020   Onychomycosis 10/12/2020   Seasonal allergies 10/12/2020   Dermatitis 10/12/2020   Sensation of lump in throat 08/19/2020   Tickle in throat 08/19/2020   Hernia, inguinal, right 05/14/2019   Pilonidal cyst 05/14/2019   Pilonidal cyst with abscess 09/06/2018   Elevated LDL cholesterol level  12/05/2015   Exercise-induced asthma 11/24/2015    Past Surgical History:  Procedure Laterality Date   PILONIDAL CYST DRAINAGE N/A 09/05/2018   In office I&D   PILONIDAL CYST EXCISION N/A 07/31/2019   Procedure: PILONIDAL CYSTECTOMY;  Surgeon: Belinda Cough, MD;  Location: [facility];  Service: General;  Laterality: N/A;   WISDOM TOOTH EXTRACTION      Family History  Problem Relation Age of Onset   Depression Mother    Hyperlipidemia Mother    Hypertension Mother    Depression Father    Diabetes Father    Hyperlipidemia Father    Depression Sister    Asthma Sister    Depression Sister    Bipolar disorder Sister    Depression Brother    Asthma Brother    Cancer Maternal Grandmother        cervical   Diabetes Maternal Grandmother    Hypertension Maternal Grandmother    Alzheimer's disease Maternal Grandmother    Cancer Maternal Grandfather        prostate   Hypertension Maternal Grandfather    Dementia Paternal Grandfather    Diabetes Maternal Uncle    Cancer Maternal Uncle     Social History   Socioeconomic History   Marital status: Married    Spouse name: Not on file   Number of children: Not on file   Years of education: Not on file   Highest education level: Bachelor's degree (e.g., BA, AB, BS)  Occupational History   Not on file  Tobacco Use   Smoking status: Never   Smokeless tobacco: Never  Vaping Use   Vaping status: Never Used  Substance and Sexual Activity   Alcohol use: Not Currently   Drug use: No   Sexual  activity: Yes    Birth control/protection: None  Other Topics Concern   Not on file  Social History Narrative   Not on file   Social Drivers of Health   Financial Resource Strain: Low Risk  (01/27/2024)   Overall Financial Resource Strain (CARDIA)    Difficulty of Paying Living Expenses: Not very hard  Food Insecurity: No Food Insecurity (01/27/2024)   Hunger Vital Sign    Worried About Running Out of Food in the Last Year: Never true    Ran Out of Food in the Last Year: Never true  Transportation Needs: No Transportation Needs (01/27/2024)   PRAPARE - Administrator, Civil Service (Medical): No    Lack of Transportation (Non-Medical): No  Physical Activity: Insufficiently Active (01/27/2024)   Exercise Vital Sign    Days of Exercise per Week: 3 days    Minutes of Exercise per Session: 30 min  Stress: Stress Concern Present (01/27/2024)   [employer] of Occupational Health - Occupational Stress Questionnaire    Feeling of Stress: To some extent  Social Connections: Socially Integrated (01/27/2024)   Social Connection and Isolation Panel    Frequency of Communication with Friends and Family: More than three times a week    Frequency of Social Gatherings with Friends and Family: Once a week    Attends Religious Services: More than 4 times per year    Active Member of [employer] or Organizations: Yes    Attends Banker Meetings: More than 4 times per year    Marital Status: Married  Catering manager Violence: Not At Risk (01/28/2024)   Humiliation, Afraid, Rape, and Kick questionnaire    Fear of Current or Ex-Partner: No    Emotionally  Abused: No    Physically Abused: No    Sexually Abused: No     Current Outpatient Medications:    Albuterol  Sulfate (PROAIR  RESPICLICK) 108 (90 Base) MCG/ACT AEPB, Inhale 1-2 puffs 30 mins prior to aerobic exercise; repeat 1-2 puff Q4H prn SOB/wheezing/bronchospasm, Disp: 1 each, Rfl: 1   atorvastatin  (LIPITOR) 10 MG tablet,  TAKE ONE TABLET BY MOUTH DAILY AT BEDTIME, Disp: 90 tablet, Rfl: 3   co-enzyme Q-10 30 MG capsule, Take 30 mg by mouth daily., Disp: , Rfl:    fluticasone  (FLONASE ) 50 MCG/ACT nasal spray, Place 2 sprays into both nostrils daily., Disp: 16 g, Rfl: 6   mupirocin  ointment (BACTROBAN ) 2 %, Apply 1 Application topically 2 (two) times daily., Disp: 22 g, Rfl: 0   omeprazole  (PRILOSEC) 20 MG capsule, Take 1 capsule (20 mg total) by mouth daily., Disp: 90 capsule, Rfl: 3  Allergies  Allergen Reactions   Mucinex [Guaifenesin Er] Other (See Comments)    Burning sensations in body, Delirious, Difficulty voiding   Doxycycline  Other (See Comments)    Headaches.     ROS  Constitutional: Negative for fever or weight change.  Respiratory: Negative for cough and shortness of breath.   Cardiovascular: Negative for chest pain or palpitations.  Gastrointestinal: Negative for abdominal pain, no bowel changes.  Musculoskeletal: Negative for gait problem or joint swelling.  Skin: Negative for rash.  Neurological: Negative for dizziness or headache.  No other specific complaints in a complete review of systems (except as listed in HPI above).    Objective  Vitals:   01/28/24 0849  BP: 122/64  Pulse: 63  Resp: 18  Temp: 98.2 F (36.8 C)  SpO2: 99%  Weight: 176 lb 1.6 oz (79.9 kg)  Height: 5' 10 (1.778 m)    Body mass index is 25.27 kg/m.  Physical Exam Vitals reviewed.  Constitutional:      Appearance: Normal appearance.  HENT:     Head: Normocephalic.     Right Ear: Tympanic membrane normal.     Left Ear: Tympanic membrane normal.     Nose: Nose normal.  Eyes:     Extraocular Movements: Extraocular movements intact.     Conjunctiva/sclera: Conjunctivae normal.     Pupils: Pupils are equal, round, and reactive to light.  Neck:     Thyroid : No thyroid  mass, thyromegaly or thyroid  tenderness.  Cardiovascular:     Rate and Rhythm: Normal rate and regular rhythm.     Pulses: Normal  pulses.     Heart sounds: Normal heart sounds.  Pulmonary:     Effort: Pulmonary effort is normal.     Breath sounds: Normal breath sounds.  Abdominal:     General: Bowel sounds are normal.     Palpations: Abdomen is soft.  Musculoskeletal:        General: Normal range of motion.     Cervical back: Normal range of motion and neck supple.     Right lower leg: No edema.     Left lower leg: No edema.  Skin:    General: Skin is warm and dry.     Capillary Refill: Capillary refill takes less than 2 seconds.  Neurological:     General: No focal deficit present.     Mental Status: He is alert and oriented to person, place, and time. Mental status is at baseline.  Psychiatric:        Mood and Affect: Mood normal.        Behavior:  Behavior normal.        Thought Content: Thought content normal.        Judgment: Judgment normal.      Recent Results (from the past 2160 hours)  CBC with Differential/Platelet     Status: None   Collection Time: 11/07/23  1:55 PM  Result Value Ref Range   WBC 5.9 3.4 - 10.8 x10E3/uL   RBC 4.54 4.14 - 5.80 x10E6/uL   Hemoglobin 13.6 13.0 - 17.7 g/dL   Hematocrit 59.4 62.4 - 51.0 %   MCV 89 79 - 97 fL   MCH 30.0 26.6 - 33.0 pg   MCHC 33.6 31.5 - 35.7 g/dL   RDW 87.6 88.3 - 84.5 %   Platelets 248 150 - 450 x10E3/uL   Neutrophils 75 Not Estab. %   Lymphs 15 Not Estab. %   Monocytes 9 Not Estab. %   Eos 0 Not Estab. %   Basos 1 Not Estab. %   Neutrophils Absolute 4.5 1.4 - 7.0 x10E3/uL   Lymphocytes Absolute 0.9 0.7 - 3.1 x10E3/uL   Monocytes Absolute 0.5 0.1 - 0.9 x10E3/uL   EOS (ABSOLUTE) 0.0 0.0 - 0.4 x10E3/uL   Basophils Absolute 0.0 0.0 - 0.2 x10E3/uL   Immature Granulocytes 0 Not Estab. %   Immature Grans (Abs) 0.0 0.0 - 0.1 x10E3/uL  Comp Met (CMET)     Status: Abnormal   Collection Time: 11/07/23  1:55 PM  Result Value Ref Range   Glucose 140 (H) 70 - 99 mg/dL   BUN 16 6 - 20 mg/dL   Creatinine, Ser 8.93 0.76 - 1.27 mg/dL   eGFR 96 >40  fO/fpw/8.26   BUN/Creatinine Ratio 15 9 - 20   Sodium 138 134 - 144 mmol/L   Potassium 4.1 3.5 - 5.2 mmol/L   Chloride 101 96 - 106 mmol/L   CO2 21 20 - 29 mmol/L   Calcium  9.5 8.7 - 10.2 mg/dL   Total Protein 7.5 6.0 - 8.5 g/dL   Albumin 4.9 4.1 - 5.1 g/dL   Globulin, Total 2.6 1.5 - 4.5 g/dL   Bilirubin Total 0.2 0.0 - 1.2 mg/dL   Alkaline Phosphatase 162 (H) 44 - 121 IU/L   AST 19 0 - 40 IU/L   ALT 19 0 - 44 IU/L  Magnesium     Status: None   Collection Time: 11/07/23  1:55 PM  Result Value Ref Range   Magnesium 2.0 1.6 - 2.3 mg/dL  TSH     Status: None   Collection Time: 11/07/23  1:55 PM  Result Value Ref Range   TSH 1.140 0.450 - 4.500 uIU/mL  [facility]Connect Molecular Screen - Blood ([facility])     Status: None   Collection Time: 01/03/24  8:24 AM  Result Value Ref Range   Genetic Analysis Overall Interpretation Negative    Genetic Disease Assessed      This is a screening test and does not detect all pathogenic or likely pathogenic variant(s) in the tested genes; diagnostic testing is recommended for individuals with a personal or family history of heart disease or hereditary cancer. [facility] Tier One  Population Screen is a screening test that analyzes 11 genes related to hereditary breast and ovarian cancer (HBOC) syndrome, Lynch syndrome, and familial hypercholesterolemia. This test only reports clinically significant pathogenic and likely  pathogenic variants but does not report variants of uncertain significance (VUS). In addition, analysis of the PMS2 gene excludes exons 11-15, which overlap with a known  pseudogene (PMS2CL).    Genetic Analysis Report      No pathogenic or likely pathogenic variants were detected in the genes analyzed by this test.Genetic test results should be interpreted in the context of an individual's personal medical and family history. Alteration to medical management is NOT  recommended based solely on this result. Clinical correlation  is advised.Additional Considerations- This is a screening test; individuals may still carry pathogenic or likely pathogenic variant(s) in the tested genes that are not detected by this test.-  For individuals at risk for these or other related conditions based on factors including personal or family history, diagnostic testing is recommended.- The absence of pathogenic or likely pathogenic variant(s) in the analyzed genes, while reassuring,  does not eliminate the possibility of a hereditary condition; there are other variants and genes associated with heart disease and hereditary cancer that are not included in this test.    Genes Tested See Notes     Comment: APOB, BRCA1, BRCA2, EPCAM, LDLR, LDLRAP1, PCSK9, PMS2, MLH1, MSH2, MSH6   Disclaimer See Notes     Comment: This test was developed and validated by [facility] This test has not been cleared or approved by the [place]  Food and Drug Administration (FDA). The [facility] laboratory is accredited by the College of American Pathologists (CAP) and certified under  the Clinical Laboratory Improvement Amendments (CLIA #: [id number]) to perform high-complexity clinical tests. This test is used for clinical purposes. It should not be regarded as investigational use only or for research use only.    Sequencing Location See Notes     Comment: Sequencing done at [facility] [address], [address], [place] [address] (CLIA# [id number])   Interpretation Methods and Limitations See Notes     Comment: Extracted DNA is enriched for targeted regions and then sequenced using the [facility] Exome+ (R) assay on an Illumina DNA sequencing system. Data is then aligned to a modified version of GRCh38 and all genes are analyzed using the MANE transcript and MANE  Plus Clinical transcript, when available. Small variant calling is completed using a customized version of [employer]'s DNAseq software, augmented by a proprietary small variant caller for difficult  variants. Copy number variants (CNVs) are then called  using a proprietary bioinformatics pipeline based on depth analysis with a comparison to similarly sequenced samples. Analysis of the PMS2 gene is limited to exons 1-10. The interpretation and reporting of variants in APOB, PCSK9, and LDLR is specific to  familial hypercholesterolemia; variants associated with hypobetalipoproteinemia are not included. Interpretation is based upon guidelines published by the [employer] and Gen[employer]), the Association for Mol ecular Pathology  (AMP) or their modification by [employer] when available and/or review of previous clinical assertions available in the [employer]. Interpretation is limited to the transcripts indicated on the report and +/- 10 bp into  intronic regions, except as noted below. [facility] variant classifications include pathogenic, likely pathogenic, variant of uncertain significance (VUS), likely benign, and benign. Only variants classified as pathogenic and likely pathogenic are included in  the report. All reported variants are confirmed through secondary manual inspection of DNA sequence data or orthogonal testing. Risk estimations and management guidelines included in this report are based on analysis of primary literature and  recommendations of applicable professional societies, and should be regarded as approximations.Based on validation studies,this assay delivers > 99% sensitivity and specificity for single nucleotide variants and insertions  and deletions (indels)  up to 20  bp. Larger indels and complex variants are also reported but sensitivity may be reduced. Based on validation studies, this assay delivers > 99% sensitivity to multi-exon CNVs and > 90% sensitivity to single-exon CNVs. This test may not detect variants  in challenging regions (such as short tandem repeats, homopolymer runs, and segment duplications), sub-exonic  CNVs, chromosomal aneuploidy, or variants in the presence of mosaicism. Phasing will be attempted and reported, when possible. Structural  rearrangements such as inversions, translocations, and gene conversions are not tested in this assay unless explicitly indicated. Additionally, deep intronic, promoter, and enhancer regions may not be covered. It is important to note that this is a  screening test and cannot detect all disease-causing variants. A negative result does not guarantee the absence of a rare, undetectable variant in the genes analyzed; consider using a diagnostic test if there is  significant personal and/or family history  of one of the conditions analyzed by this test. Any potential incidental findings outside of these genes and conditions will not be identified, nor reported. The results of a genetic test may be influenced by various factors, including bone marrow  transplantation, blood transfusions, or in rare cases, hematolymphoid neoplasms.Gene Specific Notes:APOB: analysis is limited to c.10580G>A and c.10579C>T; BRCA1: sequencing analysis extends to CDS +/-20 bp; BRCA2: sequencing analysis extends to CDS  +/-20 bp. EPCAM: analysis is limited to CNVof exons 8-9; LDLR: analysis includes CNV ofthe promoter; MLH1: analysis includes CNV of the promoter; PMS2: analysis is limited to exons 1-10.Donnice JINNY Kemp, PhD, [email or web address]      Fall Risk:    01/28/2024    8:52 AM 06/19/2022   10:53 AM 02/22/2022    9:39 AM 09/06/2018   10:16 AM  Fall Risk   Falls in the past year? 0 0 0 0   Number falls in past yr: 0 0 0 0   Injury with Fall? 0 0 0 0  Risk for fall due to :  No Fall Risks No Fall Risks   Follow up Falls evaluation completed  Falls evaluation completed  Falls evaluation completed      Data saved with a previous flowsheet row definition      Functional Status Survey:      Assessment & Plan  Problem List Items Addressed This Visit        Respiratory   Exercise-induced asthma     Other   Seasonal allergies   Hyperlipidemia   Relevant Orders   Lipid panel   Other Visit Diagnoses       Annual physical exam    -  Primary   Relevant Orders   CBC with Differential/Platelet   Comprehensive metabolic panel with GFR   Lipid panel   Hemoglobin A1c   Hepatitis C antibody   HIV Antibody (routine testing w rflx)     Encounter to establish care         Screening for deficiency anemia       Relevant Orders   CBC with Differential/Platelet     Screening for cholesterol level       Relevant Orders   Lipid panel     Screening for diabetes mellitus       Relevant Orders   Comprehensive metabolic panel with GFR   Hemoglobin A1c     Encounter for hepatitis C screening test for low risk patient       Relevant Orders   Hepatitis C antibody     Screening for HIV  without presence of risk factors       Relevant Orders   HIV Antibody (routine testing w rflx)     Muscle pain       Relevant Orders   CK     Chest pain, unspecified type       Relevant Medications   omeprazole  (PRILOSEC) 20 MG capsule     Palpitations           Assessment and Plan Assessment & Plan Palpitations and chest pain under evaluation Intermittent palpitations and chest pain, possibly related to anxiety or dietary factors. Previous Zio patch and EKG were normal. Symptoms sometimes correlate with food intake, suggesting a possible gastrointestinal component. Cardiologist is involved in ongoing evaluation. - Consider trial of omeprazole  to assess for gastrointestinal contribution to symptoms - Await results from cardiology regarding heart monitor  Hyperlipidemia Hyperlipidemia managed with atorvastatin  10 mg daily. Previous LDL was 175 mg/dL on June 13, 2022. Reports muscle pain, possibly related to physical activity rather than medication. - Continue atorvastatin  10 mg daily  Exercise-induced asthma Exercise-induced asthma, currently  well-controlled. No recent use of inhaler due to decreased exercise frequency.  Possible anxiety disorder Possible anxiety disorder contributing to palpitations and chest pain. History of panic attacks post-COVID and heightened anxiety related to health concerns.   Annual exam -labs ordered, will get them done at [facility],  patient will send in results  -Prostate cancer screening and PSA options (with potential risks and benefits of testing vs not testing) were discussed along with recent recs/guidelines. -USPSTF grade A and B recommendations reviewed with patient; age-appropriate recommendations, preventive care, screening tests, etc discussed and encouraged; healthy living encouraged; see AVS for patient education given to patient -Discussed importance of 150 minutes of physical activity weekly, eat two servings of fish weekly, eat one serving of tree nuts ( cashews, pistachios, pecans, almonds.SABRA) every other day, eat 6 servings of fruit/vegetables daily and drink plenty of water and avoid sweet beverages.  -Reviewed Health Maintenance: yes

## 2024-01-28 NOTE — Telephone Encounter (Signed)
 Done

## 2024-02-01 ENCOUNTER — Other Ambulatory Visit

## 2024-02-01 DIAGNOSIS — Z79899 Other long term (current) drug therapy: Secondary | ICD-10-CM

## 2024-02-01 DIAGNOSIS — E785 Hyperlipidemia, unspecified: Secondary | ICD-10-CM

## 2024-02-01 DIAGNOSIS — M791 Myalgia, unspecified site: Secondary | ICD-10-CM

## 2024-02-01 DIAGNOSIS — Z1159 Encounter for screening for other viral diseases: Secondary | ICD-10-CM

## 2024-02-01 DIAGNOSIS — Z0000 Encounter for general adult medical examination without abnormal findings: Secondary | ICD-10-CM

## 2024-02-01 DIAGNOSIS — Z114 Encounter for screening for human immunodeficiency virus [HIV]: Secondary | ICD-10-CM

## 2024-02-01 DIAGNOSIS — Z130 Encounter for screening for diseases of the blood and blood-forming organs and certain disorders involving the immune mechanism: Secondary | ICD-10-CM

## 2024-02-02 LAB — COMPREHENSIVE METABOLIC PANEL WITH GFR
ALT: 16 IU/L (ref 0–44)
AST: 16 IU/L (ref 0–40)
Albumin: 4.5 g/dL (ref 4.1–5.1)
Alkaline Phosphatase: 110 IU/L (ref 47–123)
BUN/Creatinine Ratio: 22 — ABNORMAL HIGH (ref 9–20)
BUN: 19 mg/dL (ref 6–20)
Bilirubin Total: 0.5 mg/dL (ref 0.0–1.2)
CO2: 24 mmol/L (ref 20–29)
Calcium: 9.5 mg/dL (ref 8.7–10.2)
Chloride: 103 mmol/L (ref 96–106)
Creatinine, Ser: 0.86 mg/dL (ref 0.76–1.27)
Globulin, Total: 2.6 g/dL (ref 1.5–4.5)
Glucose: 83 mg/dL (ref 70–99)
Potassium: 4.4 mmol/L (ref 3.5–5.2)
Sodium: 140 mmol/L (ref 134–144)
Total Protein: 7.1 g/dL (ref 6.0–8.5)
eGFR: 117 mL/min/1.73 (ref >59)

## 2024-02-02 LAB — CBC WITH DIFFERENTIAL/PLATELET
Basophils Absolute: 0 x10E3/uL (ref 0.0–0.2)
Basos: 1 %
EOS (ABSOLUTE): 0.2 x10E3/uL (ref 0.0–0.4)
Eos: 3 %
Hematocrit: 41.2 % (ref 37.5–51.0)
Hemoglobin: 13.4 g/dL (ref 13.0–17.7)
Immature Grans (Abs): 0 x10E3/uL (ref 0.0–0.1)
Immature Granulocytes: 0 %
Lymphocytes Absolute: 1.6 x10E3/uL (ref 0.7–3.1)
Lymphs: 37 %
MCH: 30 pg (ref 26.6–33.0)
MCHC: 32.5 g/dL (ref 31.5–35.7)
MCV: 92 fL (ref 79–97)
Monocytes Absolute: 0.3 x10E3/uL (ref 0.1–0.9)
Monocytes: 7 %
Neutrophils Absolute: 2.3 x10E3/uL (ref 1.4–7.0)
Neutrophils: 52 %
Platelets: 264 x10E3/uL (ref 150–450)
RBC: 4.46 x10E6/uL (ref 4.14–5.80)
RDW: 12.3 % (ref 11.6–15.4)
WBC: 4.4 x10E3/uL (ref 3.4–10.8)

## 2024-02-02 LAB — CK: Total CK: 92 U/L (ref 49–439)

## 2024-02-02 LAB — LIPID PANEL
Chol/HDL Ratio: 3.2 ratio (ref 0.0–5.0)
Cholesterol, Total: 152 mg/dL (ref 100–199)
HDL: 48 mg/dL (ref >39)
LDL Chol Calc (NIH): 89 mg/dL (ref 0–99)
Triglycerides: 76 mg/dL (ref 0–149)
VLDL Cholesterol Cal: 15 mg/dL (ref 5–40)

## 2024-02-02 LAB — HEMOGLOBIN A1C
Est. average glucose Bld gHb Est-mCnc: 105 mg/dL
Hgb A1c MFr Bld: 5.3 % (ref 4.8–5.6)

## 2024-02-02 LAB — HEPATITIS C ANTIBODY: Hep C Virus Ab: NONREACTIVE

## 2024-02-02 LAB — HIV ANTIBODY (ROUTINE TESTING W REFLEX): HIV Screen 4th Generation wRfx: NONREACTIVE

## 2024-02-04 ENCOUNTER — Ambulatory Visit: Payer: Self-pay | Admitting: Nurse Practitioner

## 2024-05-15 ENCOUNTER — Encounter (HOSPITAL_BASED_OUTPATIENT_CLINIC_OR_DEPARTMENT_OTHER): Payer: Self-pay

## 2024-05-15 ENCOUNTER — Ambulatory Visit (HOSPITAL_BASED_OUTPATIENT_CLINIC_OR_DEPARTMENT_OTHER)
Admission: RE | Admit: 2024-05-15 | Discharge: 2024-05-15 | Disposition: A | Discharge status: Home / Self Care | Source: Ambulatory Visit | Attending: Nurse Practitioner

## 2024-05-15 VITALS — BP 122/78 | HR 71 | Temp 97.9°F | Resp 20

## 2024-05-15 DIAGNOSIS — M79671 Pain in right foot: Secondary | ICD-10-CM

## 2024-05-15 NOTE — Discharge Instructions (Signed)
 I believe this is a bruise. You can soak the foot and if foreign body should work its way out. Return as needed.

## 2024-05-15 NOTE — ED Provider Notes (Signed)
 " [facility]    CSN: [id number] Arrival date & time: 05/15/24  9187      History   Chief Complaint Chief Complaint  Patient presents with   Extremity Pain    Foot hurts - maybe bite from something? - Entered by patient    HPI Troy Estrada is a 34 y.o. male.   Pt is a 34 year old male that presents with right heel pain. While taking trash out last night, stepped on something that caused pain. Small area of discoloration to right heel. Does not appear to be a foreign body. No puncture. States feels much better with shoe on.    Extremity Pain    Past Medical History:  Diagnosis Date   Allergy    Seasonal   Elevated LDL cholesterol level 12/05/2015   Exercise-induced asthma 11/24/2015   Hyperlipidemia    Pilonidal cyst with abscess 09/06/2018    Patient Active Problem List   Diagnosis Date Noted   Hyperlipidemia 01/28/2024   Panic attacks 02/22/2022   History of 2019 novel coronavirus disease (COVID-19) 11/23/2020   Onychomycosis 10/12/2020   Seasonal allergies 10/12/2020   Dermatitis 10/12/2020   Sensation of lump in throat 08/19/2020   Tickle in throat 08/19/2020   Hernia, inguinal, right 05/14/2019   Pilonidal cyst 05/14/2019   Pilonidal cyst with abscess 09/06/2018   Elevated LDL cholesterol level 12/05/2015   Exercise-induced asthma 11/24/2015    Past Surgical History:  Procedure Laterality Date   PILONIDAL CYST DRAINAGE N/A 09/05/2018   In office I&D   PILONIDAL CYST EXCISION N/A 07/31/2019   Procedure: PILONIDAL CYSTECTOMY;  Surgeon: Belinda Cough, MD;  Location: [facility];  Service: General;  Laterality: N/A;   WISDOM TOOTH EXTRACTION         Home Medications    Prior to Admission medications  Medication Sig Start Date End Date Taking? Authorizing Provider  Albuterol  Sulfate (PROAIR  RESPICLICK) 108 (90 Base) MCG/ACT AEPB Inhale 1-2 puffs 30 mins prior to aerobic exercise; repeat 1-2 puff Q4H prn SOB/wheezing/bronchospasm 04/27/23    Breeback, Jade L, PA-C  atorvastatin  (LIPITOR) 10 MG tablet TAKE ONE TABLET BY MOUTH DAILY AT BEDTIME 06/18/23   Breeback, Jade L, PA-C  co-enzyme Q-10 30 MG capsule Take 30 mg by mouth daily.    [provider]  fluticasone  (FLONASE ) 50 MCG/ACT nasal spray Place 2 sprays into both nostrils daily. 07/18/23   Geofm Delon BRAVO, NP  mupirocin  ointment (BACTROBAN ) 2 % Apply 1 Application topically 2 (two) times daily. 04/24/23   Gandhi, Safal, PA-C  omeprazole  (PRILOSEC) 20 MG capsule Take 1 capsule (20 mg total) by mouth daily. 01/28/24   Gareth Mliss FALCON, FNP    Family History Family History  Problem Relation Age of Onset   Depression Mother    Hyperlipidemia Mother    Hypertension Mother    Depression Father    Diabetes Father    Hyperlipidemia Father    Depression Sister    Asthma Sister    Depression Sister    Bipolar disorder Sister    Depression Brother    Asthma Brother    Cancer Maternal Grandmother        cervical   Diabetes Maternal Grandmother    Hypertension Maternal Grandmother    Alzheimer's disease Maternal Grandmother    Cancer Maternal Grandfather        prostate   Hypertension Maternal Grandfather    Dementia Paternal Grandfather    Diabetes Maternal Uncle    Cancer  Maternal Uncle     Social History Social History[1]   Allergies   Mucinex [guaifenesin er] and Doxycycline    Review of Systems Review of Systems See HPI  Physical Exam Triage Vital Signs ED Triage Vitals  Encounter Vitals Group     BP 05/15/24 0826 122/78     Girls Systolic BP Percentile --      Girls Diastolic BP Percentile --      Boys Systolic BP Percentile --      Boys Diastolic BP Percentile --      Pulse Rate 05/15/24 0826 71     Resp 05/15/24 0826 20     Temp 05/15/24 0826 97.9 F (36.6 C)     Temp Source 05/15/24 0826 Oral     SpO2 05/15/24 0826 96 %     Weight --      Height --      Head Circumference --      Peak Flow --      Pain Score 05/15/24 0828 6      Pain Loc --      Pain Education --      Exclude from Growth Chart --    No data found.  Updated Vital Signs BP 122/78 (BP Location: Right Arm)   Pulse 71   Temp 97.9 F (36.6 C) (Oral)   Resp 20   SpO2 96%   Visual Acuity Right Eye Distance:   Left Eye Distance:   Bilateral Distance:    Right Eye Near:   Left Eye Near:    Bilateral Near:     Physical Exam Constitutional:      Appearance: Normal appearance.  Pulmonary:     Effort: Pulmonary effort is normal.  Musculoskeletal:        General: Normal range of motion.  Skin:    General: Skin is warm and dry.     Comments: Very tiny area of discoloration to right heel.  No obvious puncture wounds or palpation of foreign bodies.  Neurological:     Mental Status: He is alert.  Psychiatric:        Mood and Affect: Mood normal.      UC Treatments / Results  Labs (all labs ordered are listed, but only abnormal results are displayed) Labs Reviewed - No data to display  EKG   Radiology No results found.  Procedures Procedures (including critical care time)  Medications Ordered in UC Medications - No data to display  Initial Impression / Assessment and Plan / UC Course  I have reviewed the triage vital signs and the nursing notes.  Pertinent labs & imaging results that were available during my care of the patient were reviewed by me and considered in my medical decision making (see chart for details).     Right heel pain-this is most likely a bruise.  Recommending soak the foot and if there is a small foreign body she will work its way out.  Can return here as needed for further problems Final Clinical Impressions(s) / UC Diagnoses   Final diagnoses:  Pain of right heel     Discharge Instructions      I believe this is a bruise. You can soak the foot and if foreign body should work its way out. Return as needed.      ED Prescriptions   None    PDMP not reviewed this encounter.     [1]   Social History Tobacco Use   Smoking status: Never  Smokeless tobacco: Never  Vaping Use   Vaping status: Never Used  Substance Use Topics   Alcohol use: Not Currently   Drug use: No     Adah Wilbert LABOR, FNP 05/15/24 0902  "

## 2024-05-15 NOTE — ED Triage Notes (Signed)
 While taking trash out last night, stepped on something that caused pain. Small area of discoloration to right heel. Does not appear to be a foreign body. No puncture. States feels much better with shoe on.

## 2024-05-29 ENCOUNTER — Encounter: Payer: Self-pay | Admitting: Nurse Practitioner
